# Patient Record
Sex: Male | Born: 1985 | Race: Black or African American | Hispanic: No | Marital: Married | Smoking: Never smoker
Health system: Southern US, Community
[De-identification: ages and names within clinical notes are randomized; demographics above are authoritative.]

## PROBLEM LIST (undated history)

## (undated) DIAGNOSIS — J45909 Unspecified asthma, uncomplicated: Secondary | ICD-10-CM

## (undated) DIAGNOSIS — K219 Gastro-esophageal reflux disease without esophagitis: Secondary | ICD-10-CM

---

## 2007-02-05 ENCOUNTER — Ambulatory Visit: Payer: Self-pay | Admitting: Internal Medicine

## 2007-05-05 ENCOUNTER — Ambulatory Visit: Payer: Self-pay | Admitting: Internal Medicine

## 2007-05-07 ENCOUNTER — Ambulatory Visit: Payer: Self-pay | Admitting: Internal Medicine

## 2007-10-27 ENCOUNTER — Ambulatory Visit: Payer: Self-pay | Admitting: Internal Medicine

## 2007-11-02 ENCOUNTER — Ambulatory Visit: Payer: Self-pay | Admitting: *Deleted

## 2008-02-21 ENCOUNTER — Emergency Department (HOSPITAL_COMMUNITY): Admission: EM | Admit: 2008-02-21 | Discharge: 2008-02-21 | Payer: Self-pay | Admitting: Emergency Medicine

## 2008-05-12 ENCOUNTER — Emergency Department (HOSPITAL_COMMUNITY): Admission: EM | Admit: 2008-05-12 | Discharge: 2008-05-12 | Payer: Self-pay | Admitting: Family Medicine

## 2014-03-13 DIAGNOSIS — Z8719 Personal history of other diseases of the digestive system: Secondary | ICD-10-CM | POA: Insufficient documentation

## 2014-03-13 DIAGNOSIS — M62838 Other muscle spasm: Secondary | ICD-10-CM | POA: Insufficient documentation

## 2015-06-01 DIAGNOSIS — E291 Testicular hypofunction: Secondary | ICD-10-CM | POA: Insufficient documentation

## 2015-06-01 DIAGNOSIS — N500 Atrophy of testis: Secondary | ICD-10-CM | POA: Insufficient documentation

## 2015-06-01 DIAGNOSIS — R868 Other abnormal findings in specimens from male genital organs: Secondary | ICD-10-CM | POA: Insufficient documentation

## 2015-08-02 DIAGNOSIS — R868 Other abnormal findings in specimens from male genital organs: Secondary | ICD-10-CM | POA: Insufficient documentation

## 2015-08-02 DIAGNOSIS — E6609 Other obesity due to excess calories: Secondary | ICD-10-CM | POA: Insufficient documentation

## 2017-11-17 ENCOUNTER — Other Ambulatory Visit: Payer: Self-pay | Admitting: Family Medicine

## 2017-11-17 DIAGNOSIS — R109 Unspecified abdominal pain: Secondary | ICD-10-CM

## 2018-02-01 ENCOUNTER — Ambulatory Visit
Admission: RE | Admit: 2018-02-01 | Discharge: 2018-02-01 | Disposition: A | Discharge status: Home / Self Care | Payer: BC Managed Care – PPO | Source: Ambulatory Visit | Attending: Family Medicine | Admitting: Family Medicine

## 2018-02-01 DIAGNOSIS — R109 Unspecified abdominal pain: Secondary | ICD-10-CM

## 2019-01-07 ENCOUNTER — Ambulatory Visit (INDEPENDENT_AMBULATORY_CARE_PROVIDER_SITE_OTHER): Payer: BC Managed Care – PPO

## 2019-01-07 ENCOUNTER — Other Ambulatory Visit: Payer: BC Managed Care – PPO

## 2019-01-07 ENCOUNTER — Other Ambulatory Visit: Payer: Self-pay

## 2019-01-07 ENCOUNTER — Encounter: Payer: Self-pay | Admitting: Family Medicine

## 2019-01-07 ENCOUNTER — Ambulatory Visit: Payer: BC Managed Care – PPO | Admitting: Family Medicine

## 2019-01-07 VITALS — BP 132/84 | HR 85 | Temp 97.6°F | Ht 77.0 in | Wt 330.0 lb

## 2019-01-07 DIAGNOSIS — M79672 Pain in left foot: Secondary | ICD-10-CM | POA: Diagnosis not present

## 2019-01-07 DIAGNOSIS — K219 Gastro-esophageal reflux disease without esophagitis: Secondary | ICD-10-CM | POA: Insufficient documentation

## 2019-01-07 DIAGNOSIS — G43709 Chronic migraine without aura, not intractable, without status migrainosus: Secondary | ICD-10-CM | POA: Diagnosis not present

## 2019-01-07 DIAGNOSIS — IMO0002 Reserved for concepts with insufficient information to code with codable children: Secondary | ICD-10-CM | POA: Insufficient documentation

## 2019-01-07 MED ORDER — DICLOFENAC SODIUM 75 MG PO TBEC: 75.0000 mg | DELAYED_RELEASE_TABLET | Freq: Two times a day (BID) | ORAL | 0 refills | Status: DC

## 2019-01-07 MED ORDER — PANTOPRAZOLE SODIUM 40 MG PO TBEC: 40.0000 mg | DELAYED_RELEASE_TABLET | Freq: Every day | ORAL | 0 refills | Status: DC

## 2019-01-07 NOTE — Assessment & Plan Note (Signed)
No red flags.  Will obtain records from GI.  Start Protonix 40 mg daily for the next several weeks.  Will need to check for H. pylori if it has not already been done.

## 2019-01-07 NOTE — Patient Instructions (Signed)
It was very nice to see you today!  Please make sure that you wear footwear with good arch support.  It would be a good idea to get inserts with arch support.  Please try the Voltaren twice daily for the next 1 to 2 weeks.  I would also like to get an x-ray today.  Please start the Protonix.  Take 1 pill daily for the next 6 to 8 weeks  Please try taking riboflavin to help with your migraines.  Please come back to see me in a few weeks for your physical.  Take care, Dr Jerline Pain  Please try these tips to maintain a healthy lifestyle:   Eat at least 3 REAL meals and 1-2 snacks per day.  Aim for no more than 5 hours between eating.  If you eat breakfast, please do so within one hour of getting up.    Obtain twice as many fruits/vegetables as protein or carbohydrate foods for both lunch and dinner. (Half of each meal should be fruits/vegetables, one quarter protein, and one quarter starchy carbs)   Cut down on sweet beverages. This includes juice, soda, and sweet tea.    Exercise at least 150 minutes every week.

## 2019-01-07 NOTE — Assessment & Plan Note (Signed)
No red flags.  Recommended riboflavin.  Would consider referral to headache specialist if continues to have persistent symptoms that are bothersome.

## 2019-01-07 NOTE — Progress Notes (Signed)
Chief Complaint:  Joseph [name] is a 33 y.o. male who presents today with a chief complaint of foot pain and to establish care.   Assessment/Plan:  Foot pain Has some pain along fifth metatarsal on palpation.  Patient does note that he tends to lead with his left foot and placement of his body weight on his left foot.  Will check plain film given the symptoms of been going on for several months.  We will start diclofenac 75 mg twice daily for the next couple weeks.  Also recommended footwear with good arch support.  Chronic migraine No red flags.  Recommended riboflavin.  Would consider referral to headache specialist if continues to have persistent symptoms that are bothersome.  Gastroesophageal reflux disease No red flags.  Will obtain records from GI.  Start Protonix 40 mg daily for the next several weeks.  Will need to check for H. pylori if it has not already been done.     Subjective:  HPI:  Foot Pain Started a few months ago.  Located on lateral aspect of his left foot.  Symptoms come and go.  Usually worse after prolonged periods of rest and then resolve after a few minutes of walking.  Usually has it for second the morning.  Also happens after driving for long distances.  Went to an urgent care and was told that he had inflammation and was recommended to take ibuprofen.  No trauma.  No obvious precipitating events.  No other treatments tried.  No other obvious aggravating or alleviating factors.  Symptoms are currently manageable.   His chronic medical conditions are outlined below:  # GERD - Has followed with GI in the past -Endoscopy in 2020 was reportedly normal -Improved with dietary modifications.  # Chronic Migraines - Has seen headache specialist in the past -Not currently on any medications - Past migraines at least twice per week.  Sometimes wake him up from sleep.  ROS: Per HPI, otherwise a complete review of systems was negative.   PMH:  The following  were reviewed and entered/updated in epic: History reviewed. No pertinent past medical history. Patient Active Problem List   Diagnosis Date Noted  . Gastroesophageal reflux disease 01/07/2019  . Chronic migraine 01/07/2019   History reviewed. No pertinent surgical history.  Family History  Problem Relation Age of Onset  . Hypertension Mother   . Fibromyalgia Mother   . Hypertension Father   . Diabetes Father     Medications- reviewed and updated Current Outpatient Medications  Medication Sig Dispense Refill  . diclofenac (VOLTAREN) 75 MG EC tablet Take 1 tablet (75 mg total) by mouth 2 (two) times daily. 30 tablet 0  . pantoprazole (PROTONIX) 40 MG tablet Take 1 tablet (40 mg total) by mouth daily. 90 tablet 0   No current facility-administered medications for this visit.     Allergies-reviewed and updated No Known Allergies  Social History   Socioeconomic History  . Marital status: Married    Spouse name: Not on file  . Number of children: Not on file  . Years of education: Not on file  . Highest education level: Not on file  Occupational History  . Not on file  Social Needs  . Financial resource strain: Not on file  . Food insecurity    Worry: Not on file    Inability: Not on file  . Transportation needs    Medical: Not on file    Non-medical: Not on file  Tobacco Use  .  Smoking status: Passive Smoke Exposure - Never Smoker  . Smokeless tobacco: Never Used  Substance and Sexual Activity  . Alcohol use: Never    Frequency: Never  . Drug use: Never  . Sexual activity: Not on file  Lifestyle  . Physical activity    Days per week: Not on file    Minutes per session: Not on file  . Stress: Not on file  Relationships  . Social Musician on phone: Not on file    Gets together: Not on file    Attends religious service: Not on file    Active member of club or organization: Not on file    Attends meetings of clubs or organizations: Not on file     Relationship status: Not on file  Other Topics Concern  . Not on file  Social History Narrative  . Not on file          Objective:  Physical Exam: BP 132/84 (BP Location: Left Arm, Patient Position: Sitting, Cuff Size: Large)   Pulse 85   Temp 97.6 F (36.4 C) (Temporal)   Ht 6\' 5"  (1.956 m)   Wt (!) 330 lb (149.7 kg)   SpO2 99%   BMI 39.13 kg/m   Gen: NAD, resting comfortably CV: Regular rate and rhythm with no murmurs appreciated Pulm: Normal work of breathing, clear to auscultation bilaterally with no crackles, wheezes, or rhonchi GI: Normal bowel sounds present. Soft, Nontender, Nondistended. MSK: No edema, cyanosis, or clubbing noted. -Left foot: No deformities.  Tender to palpation along distal fifth metacarpal and base of fifth metacarpal.  Pes planus noted.  Neurovascular intact distally Skin: Warm, dry Neuro: Grossly normal, moves all extremities Psych: Normal affect and thought content      Joseph [name] M. , MD 01/07/2019 9:35 AM

## 2019-01-10 NOTE — Progress Notes (Signed)
Please inform patient of the following:  Xray shows to fracture or dislocation. No degenerative changes. Would like for him to let us know if not improving and we can refer him to see sports medicine.  Algis Greenhouse. Jerline Pain, MD 01/10/2019 8:24 AM

## 2019-02-01 ENCOUNTER — Ambulatory Visit (INDEPENDENT_AMBULATORY_CARE_PROVIDER_SITE_OTHER): Payer: BC Managed Care – PPO | Admitting: Family Medicine

## 2019-02-01 ENCOUNTER — Other Ambulatory Visit: Payer: Self-pay

## 2019-02-01 ENCOUNTER — Ambulatory Visit: Payer: Self-pay

## 2019-02-01 ENCOUNTER — Encounter: Payer: Self-pay | Admitting: Family Medicine

## 2019-02-01 VITALS — BP 128/82 | HR 80 | Ht 77.0 in | Wt 339.0 lb

## 2019-02-01 VITALS — BP 128/82 | HR 80 | Temp 98.0°F | Ht 77.0 in | Wt 339.0 lb

## 2019-02-01 DIAGNOSIS — M79672 Pain in left foot: Secondary | ICD-10-CM

## 2019-02-01 DIAGNOSIS — Z6841 Body Mass Index (BMI) 40.0 and over, adult: Secondary | ICD-10-CM

## 2019-02-01 DIAGNOSIS — M7062 Trochanteric bursitis, left hip: Secondary | ICD-10-CM

## 2019-02-01 DIAGNOSIS — Z13220 Encounter for screening for lipoid disorders: Secondary | ICD-10-CM | POA: Diagnosis not present

## 2019-02-01 DIAGNOSIS — K219 Gastro-esophageal reflux disease without esophagitis: Secondary | ICD-10-CM

## 2019-02-01 DIAGNOSIS — E669 Obesity, unspecified: Secondary | ICD-10-CM

## 2019-02-01 DIAGNOSIS — G43709 Chronic migraine without aura, not intractable, without status migrainosus: Secondary | ICD-10-CM

## 2019-02-01 DIAGNOSIS — Z0001 Encounter for general adult medical examination with abnormal findings: Secondary | ICD-10-CM

## 2019-02-01 DIAGNOSIS — IMO0002 Reserved for concepts with insufficient information to code with codable children: Secondary | ICD-10-CM

## 2019-02-01 LAB — LIPID PANEL
Cholesterol: 122 mg/dL (ref 0–200)
HDL: 46.5 mg/dL (ref >39.00)
LDL Cholesterol: 66 mg/dL (ref 0–99)
NonHDL: 75.18
Total CHOL/HDL Ratio: 3
Triglycerides: 48 mg/dL (ref 0.0–149.0)
VLDL: 9.6 mg/dL (ref 0.0–40.0)

## 2019-02-01 LAB — COMPREHENSIVE METABOLIC PANEL
ALT: 31 U/L (ref 0–53)
AST: 20 U/L (ref 0–37)
Albumin: 4.3 g/dL (ref 3.5–5.2)
Alkaline Phosphatase: 73 U/L (ref 39–117)
BUN: 13 mg/dL (ref 6–23)
CO2: 25 mEq/L (ref 19–32)
Calcium: 9.5 mg/dL (ref 8.4–10.5)
Chloride: 105 mEq/L (ref 96–112)
Creatinine, Ser: 0.91 mg/dL (ref 0.40–1.50)
GFR: 115.18 mL/min (ref >60.00)
Glucose, Bld: 90 mg/dL (ref 70–99)
Potassium: 4.3 mEq/L (ref 3.5–5.1)
Sodium: 138 mEq/L (ref 135–145)
Total Bilirubin: 0.6 mg/dL (ref 0.2–1.2)
Total Protein: 7.3 g/dL (ref 6.0–8.3)

## 2019-02-01 LAB — CBC
HCT: 42.8 % (ref 39.0–52.0)
Hemoglobin: 14 g/dL (ref 13.0–17.0)
MCHC: 32.8 g/dL (ref 30.0–36.0)
MCV: 89.7 fl (ref 78.0–100.0)
Platelets: 266 10*3/uL (ref 150.0–400.0)
RBC: 4.77 Mil/uL (ref 4.22–5.81)
RDW: 13.1 % (ref 11.5–15.5)
WBC: 3.7 10*3/uL — ABNORMAL LOW (ref 4.0–10.5)

## 2019-02-01 LAB — H. PYLORI ANTIBODY, IGG: H Pylori IgG: NEGATIVE

## 2019-02-01 LAB — TSH: TSH: 1.11 u[IU]/mL (ref 0.35–4.50)

## 2019-02-01 NOTE — Progress Notes (Signed)
Chief Complaint:  Joseph [name] is a 33 y.o. male who presents today for his annual comprehensive physical exam.    Assessment/Plan:  Gastroesophageal reflux disease Check H. pylori.  Continue Protonix 40 mg daily.  Chronic migraine Stable.  Left Foot Pain Referral placed to sport medicine.   Body mass index is 40.2 kg/m. / Obese BMI Metric Follow Up - 02/01/19 0915      BMI Metric Follow Up-Please document annually   BMI Metric Follow Up  Education provided       Preventative Healthcare: Check CBC, CMEt, TSH, and lipids  Patient Counseling(The following topics were reviewed and/or handout was given):  -Nutrition: Stressed importance of moderation in sodium/caffeine intake, saturated fat and cholesterol, caloric balance, sufficient intake of fresh fruits, vegetables, and fiber.  -Stressed the importance of regular exercise.   -Substance Abuse: Discussed cessation/primary prevention of tobacco, alcohol, or other drug use; driving or other dangerous activities under the influence; availability of treatment for abuse.   -Injury prevention: Discussed safety belts, safety helmets, smoke detector, smoking near bedding or upholstery.   -Sexuality: Discussed sexually transmitted diseases, partner selection, use of condoms, avoidance of unintended pregnancy and contraceptive alternatives.   -Dental health: Discussed importance of regular tooth brushing, flossing, and dental visits.  -Health maintenance and immunizations reviewed. Please refer to Health maintenance section.  Return to care in 1 year for next preventative visit.     Subjective:  HPI:  He has no acute complaints today.   He was seen a few weeks ago for left foot pain.  Started on diclofenac.  Symptoms have not improved.  X-ray was essentially negative.  His stable, chronic medical conditions are outlined below:   # GERD - On protonix 40mg  daily and tolerating well - ROS: No reported unintentional weight loss or  early satiety.  # Chronic Migraine  Lifestyle Diet: No specific diets or eating plans. Trying to cut out fried foods.  Exercise: No specific activities. Limited due to foot pain.   Depression screen PHQ 2/9 01/07/2019  Decreased Interest 0  Down, Depressed, Hopeless 0  PHQ - 2 Score 0    Health Maintenance Due  Topic Date Due  . HIV Screening  10/06/1999  . TETANUS/TDAP  10/06/2003     ROS: Per HPI, otherwise a complete review of systems was negative.   PMH:  The following were reviewed and entered/updated in epic: History reviewed. No pertinent past medical history. Patient Active Problem List   Diagnosis Date Noted  . Gastroesophageal reflux disease 01/07/2019  . Chronic migraine 01/07/2019   History reviewed. No pertinent surgical history.  Family History  Problem Relation Age of Onset  . Hypertension Mother   . Fibromyalgia Mother   . Hypertension Father   . Diabetes Father     Medications- reviewed and updated Current Outpatient Medications  Medication Sig Dispense Refill  . diclofenac (VOLTAREN) 75 MG EC tablet Take 1 tablet (75 mg total) by mouth 2 (two) times daily. 30 tablet 0  . pantoprazole (PROTONIX) 40 MG tablet Take 1 tablet (40 mg total) by mouth daily. 90 tablet 0   No current facility-administered medications for this visit.     Allergies-reviewed and updated No Known Allergies  Social History   Socioeconomic History  . Marital status: Married    Spouse name: Not on file  . Number of children: Not on file  . Years of education: Not on file  . Highest education level: Not on file  Occupational History  .  Not on file  Social Needs  . Financial resource strain: Not on file  . Food insecurity    Worry: Not on file    Inability: Not on file  . Transportation needs    Medical: Not on file    Non-medical: Not on file  Tobacco Use  . Smoking status: Passive Smoke Exposure - Never Smoker  . Smokeless tobacco: Never Used  Substance and  Sexual Activity  . Alcohol use: Never    Frequency: Never  . Drug use: Never  . Sexual activity: Not on file  Lifestyle  . Physical activity    Days per week: Not on file    Minutes per session: Not on file  . Stress: Not on file  Relationships  . Social Musician on phone: Not on file    Gets together: Not on file    Attends religious service: Not on file    Active member of club or organization: Not on file    Attends meetings of clubs or organizations: Not on file    Relationship status: Not on file  Other Topics Concern  . Not on file  Social History Narrative  . Not on file        Objective:  Physical Exam: BP 128/82 (BP Location: Left Arm, Patient Position: Sitting, Cuff Size: Large)   Pulse 80   Temp 98 F (36.7 C) (Temporal)   Ht 6\' 5"  (1.956 m)   Wt (!) 339 lb (153.8 kg)   SpO2 98%   BMI 40.20 kg/m   Body mass index is 40.2 kg/m. Wt Readings from Last 3 Encounters:  02/01/19 (!) 339 lb (153.8 kg)  01/07/19 (!) 330 lb (149.7 kg)   Gen: NAD, resting comfortably HEENT: TMs normal bilaterally. OP clear. No thyromegaly noted.  CV: RRR with no murmurs appreciated Pulm: NWOB, CTAB with no crackles, wheezes, or rhonchi GI: Normal bowel sounds present. Soft, Nontender, Nondistended. MSK: no edema, cyanosis, or clubbing noted Skin: warm, dry Neuro: CN2-12 grossly intact. Strength 5/5 in upper and lower extremities. Reflexes symmetric and intact bilaterally.  Psych: Normal affect and thought content      M. 13/06/20, MD 02/01/2019 9:16 AM

## 2019-02-01 NOTE — Assessment & Plan Note (Signed)
Stable

## 2019-02-01 NOTE — Patient Instructions (Addendum)
Please perform the exercise program that we have prepared for you and gone over in detail on a daily basis.  In addition to the handout you were provided you can access your program through: [email or web address]   Your unique program code is:  [id number]  Use the voltaren gel for pain up to 4x daily as needed.  Consider high quality sports insoles.   Recheck in 1 month if not improving.

## 2019-02-01 NOTE — Patient Instructions (Signed)
It was very nice to see you today!  We will check blood work today.  I will place a referral for you to see Dr Georgina Snell - please schedule today.  Come back in a year for your next physical, or sooner if needed.   Take care, Dr Jerline Pain  Please try these tips to maintain a healthy lifestyle:   Eat at least 3 REAL meals and 1-2 snacks per day.  Aim for no more than 5 hours between eating.  If you eat breakfast, please do so within one hour of getting up.    Obtain twice as many fruits/vegetables as protein or carbohydrate foods for both lunch and dinner. (Half of each meal should be fruits/vegetables, one quarter protein, and one quarter starchy carbs)   Cut down on sweet beverages. This includes juice, soda, and sweet tea.    Exercise at least 150 minutes every week.    Preventive Care 2-69 Years Old, Male Preventive care refers to lifestyle choices and visits with your health care provider that can promote health and wellness. This includes:  A yearly physical exam. This is also called an annual well check.  Regular dental and eye exams.  Immunizations.  Screening for certain conditions.  Healthy lifestyle choices, such as eating a healthy diet, getting regular exercise, not using drugs or products that contain nicotine and tobacco, and limiting alcohol use. What can I expect for my preventive care visit? Physical exam Your health care provider will check:  Height and weight. These may be used to calculate body mass index (BMI), which is a measurement that tells if you are at a healthy weight.  Heart rate and blood pressure.  Your skin for abnormal spots. Counseling Your health care provider may ask you questions about:  Alcohol, tobacco, and drug use.  Emotional well-being.  Home and relationship well-being.  Sexual activity.  Eating habits.  Work and work Statistician. What immunizations do I need?  Influenza (flu) vaccine  This is recommended every year.  Tetanus, diphtheria, and pertussis (Tdap) vaccine  You may need a Td booster every 10 years. Varicella (chickenpox) vaccine  You may need this vaccine if you have not already been vaccinated. Human papillomavirus (HPV) vaccine  If recommended by your health care provider, you may need three doses over 6 months. Measles, mumps, and rubella (MMR) vaccine  You may need at least one dose of MMR. You may also need a second dose. Meningococcal conjugate (MenACWY) vaccine  One dose is recommended if you are 4-57 years old and a Market researcher living in a residence hall, or if you have one of several medical conditions. You may also need additional booster doses. Pneumococcal conjugate (PCV13) vaccine  You may need this if you have certain conditions and were not previously vaccinated. Pneumococcal polysaccharide (PPSV23) vaccine  You may need one or two doses if you smoke cigarettes or if you have certain conditions. Hepatitis A vaccine  You may need this if you have certain conditions or if you travel or work in places where you may be exposed to hepatitis A. Hepatitis B vaccine  You may need this if you have certain conditions or if you travel or work in places where you may be exposed to hepatitis B. Haemophilus influenzae type b (Hib) vaccine  You may need this if you have certain risk factors. You may receive vaccines as individual doses or as more than one vaccine together in one shot (combination vaccines). Talk with your health  care provider about the risks and benefits of combination vaccines. What tests do I need? Blood tests  Lipid and cholesterol levels. These may be checked every 5 years starting at age 32.  Hepatitis C test.  Hepatitis B test. Screening   Diabetes screening. This is done by checking your blood sugar (glucose) after you have not eaten for a while (fasting).  Sexually transmitted disease (STD) testing. Talk with your health care  provider about your test results, treatment options, and if necessary, the need for more tests. Follow these instructions at home: Eating and drinking   Eat a diet that includes fresh fruits and vegetables, whole grains, lean protein, and low-fat dairy products.  Take vitamin and mineral supplements as recommended by your health care provider.  Do not drink alcohol if your health care provider tells you not to drink.  If you drink alcohol: ? Limit how much you have to 0-2 drinks a day. ? Be aware of how much alcohol is in your drink. In the [place] one drink equals one 12 oz bottle of beer (355 mL), one 5 oz glass of wine (148 mL), or one 1 oz glass of hard liquor (44 mL). Lifestyle  Take daily care of your teeth and gums.  Stay active. Exercise for at least 30 minutes on 5 or more days each week.  Do not use any products that contain nicotine or tobacco, such as cigarettes, e-cigarettes, and chewing tobacco. If you need help quitting, ask your health care provider.  If you are sexually active, practice safe sex. Use a condom or other form of protection to prevent STIs (sexually transmitted infections). What's next?  Go to your health care provider once a year for a well check visit.  Ask your health care provider how often you should have your eyes and teeth checked.  Stay up to date on all vaccines. This information is not intended to replace advice given to you by your health care provider. Make sure you discuss any questions you have with your health care provider. Document Released: 04/15/2001 Document Revised: 02/11/2018 Document Reviewed: 02/11/2018 Elsevier Patient Education  2020 [employer].

## 2019-02-01 NOTE — Progress Notes (Signed)
Subjective:    I'm seeing this patient as a consultation for:  Dr Jimmey Ralph.  Note will be routed back to referring provider/PCP.  I, [name], LAT, ATC, am serving as scribe for Dr. [name].  CC: L foot pain  HPI: Pt is a 33 y/o male presenting w/ c/o L foot pain x several months.  He saw his PCP for this issue initially on 01/07/19 and had an XR of his L foot and was prescribed oral diclofenac 75 mg bid.  Currently, pt notes pain located along his L lateral/plantar foot w/ insidious onset.  He reports no change in his symptoms since starting the oral diclofenac.  He rates his pain as a 4-5/10 when he first gets out of bed.  He also notes increased pain after getting out of the car or after sitting in a resting position for a prolonged period of time.  He has tried the oral diclofenac and some OTC shoe inserts.  He does not report any swelling in the area of his pain.  Additionally he notes bilateral left worse than right lateral hip pain.  He had a fall a few years ago and developed he reports hip tendinopathy.  He is done some treatment for this off-and-on but notes recently has been a little more painful especially the lateral hip on the left side.  Pain is worse with activity and better with rest.  Past medical history, Surgical history, Family history not pertinant except as noted below, Social history, Allergies, and medications have been entered into the medical record, reviewed, and no changes needed.   Review of Systems: No headache, visual changes, nausea, vomiting, diarrhea, constipation, dizziness, abdominal pain, skin rash, fevers, chills, night sweats, weight loss, swollen lymph nodes, body aches, joint swelling, muscle aches, chest pain, shortness of breath, mood changes, visual or auditory hallucinations.   Objective:    Vitals:   02/01/19 0935  BP: 128/82  Pulse: 80  SpO2: 98%   General: Well Developed, well nourished, and in no acute distress.  Neuro/Psych: Alert and  oriented x3, extra-ocular muscles intact, able to move all 4 extremities, sensation grossly intact. Skin: Warm and dry, no rashes noted.  Respiratory: Not using accessory muscles, speaking in full sentences, trachea midline.  Cardiovascular: Pulses palpable, no extremity edema. Abdomen: Does not appear distended. MSK:  Left hip: Normal-appearing not particularly tender.  Hip abduction strength diminished 4/5.  External rotation strength intact 5/5 internal rotation and adduction intact 5/5.  Right hip: Normal-appearing nontender. Hip abduction strength slightly diminished 4+/5.  External rotation strength and internal rotation and adduction intact 5/5.  Left foot: Pes planus otherwise normal-appearing Normal foot and ankle motion. Nontender to palpation. Intact strength to dorsiflexion plantarflexion eversion inversion.   Right foot: Pes planus otherwise normal. Normal foot and ankle motion. Nontender to palpation. Intact strength.  Pulses cap refill and sensation intact feet bilaterally.  Lab and Radiology Results Dg Foot Complete Left  Result Date: 01/07/2019 CLINICAL DATA:  Pain EXAM: LEFT FOOT - COMPLETE 3+ VIEW COMPARISON:  None. FINDINGS: Frontal, oblique, and lateral views were obtained. There is no demonstrable fracture or dislocation. Joint spaces appear unremarkable. Note that there is persistent flexion of the fourth and fifth PIP joints. No erosive change. IMPRESSION: No fracture or dislocation. No appreciable joint space narrowing or erosion. Persistent flexion of fourth and fifth PIP joints. Electronically Signed   By: Bretta Bang III M.D.   On: 01/07/2019 15:23   I, [name], personally (  independently) visualized and performed the interpretation of the images attached in this note.  Limited musculoskeletal ultrasound lateral left foot and ankle. Normal-appearing peroneal tendons.  Normal-appearing fifth metatarsal Normal bony structures. Impression: Normal  left lateral foot musculoskeletal ultrasound.  Impression and Recommendations:    Assessment and Plan: 33 y.o. male with  Left lateral plantar foot pain.  Predominant pain occurs with standing after period of rest and is consistent in description but not location with plantar fasciitis.  It is possible he has plantar fasciitis of the lateral band of the plantar fascia or tendinitis of the posterior tibialis tendon as it courses to the plantar aspect of the foot or the peroneal longus tendon also coursing on the plantar aspect of the foot.  Regardless we will treat with home exercise program focused on eccentric exercises.  Additionally will use diclofenac gel and recommend high-quality sports insoles.  Recheck in 1 month if not all better.  Left lateral hip pain.  Hip abductor tendinopathy/trochanteric bursitis.  Work on home exercise program. Recheck 1 month as above.  PDMP not reviewed this encounter. Orders Placed This Encounter  Procedures  . NO CHG - [place] LOWER LEFT    Order Specific Question:   Reason for Exam (SYMPTOM  OR DIAGNOSIS REQUIRED)    Answer:   L foot pain    Order Specific Question:   Preferred imaging location?    Answer:   [facility]   No orders of the defined types were placed in this encounter.   Discussed warning signs or symptoms. Please see discharge instructions. Patient expresses understanding.  The above documentation has been reviewed and is accurate and complete Lynne Leader

## 2019-02-01 NOTE — Assessment & Plan Note (Signed)
Check H. pylori.  Continue Protonix 40 mg daily.

## 2019-02-02 NOTE — Progress Notes (Signed)
Please inform patient of the following:  Good news! All of his blood work is NORMAL. Do not need to make any changes to his treatment plan at this time. Would like for him to continue working on diet and exercise and we can recheck in a year or so.  Joseph [name]. Jerline Pain, MD 02/02/2019 7:52 AM

## 2019-02-23 ENCOUNTER — Encounter: Payer: Self-pay | Admitting: Family Medicine

## 2019-03-30 ENCOUNTER — Other Ambulatory Visit: Payer: Self-pay | Admitting: Family Medicine

## 2019-05-03 ENCOUNTER — Ambulatory Visit (INDEPENDENT_AMBULATORY_CARE_PROVIDER_SITE_OTHER): Payer: BC Managed Care – PPO | Admitting: Family Medicine

## 2019-05-03 DIAGNOSIS — IMO0002 Reserved for concepts with insufficient information to code with codable children: Secondary | ICD-10-CM

## 2019-05-03 DIAGNOSIS — G43709 Chronic migraine without aura, not intractable, without status migrainosus: Secondary | ICD-10-CM | POA: Diagnosis not present

## 2019-05-03 NOTE — Progress Notes (Signed)
   Joseph [name] is a 34 y.o. male who presents today for a virtual office visit.  Assessment/Plan:  Chronic Problems Addressed Today: Chronic migraine Worsened recommended he restart the supplements including riboflavin.  Will place referral to headache clinic per patient request.  Discussed reasons return to care.      Subjective:  HPI:  Patient started having change in migraine pattern couple weeks ago.  Now involving bilateral aspect of his head.  He has had migraines in the past.  His actual headaches have been about the same but has had some associated symptoms that are new.  He has had some increased lethargy and fatigue.  Is also had some increased brain fog and dizzy spells.  Symptoms are stable. No weakness or numbness. No nausea or vomiting.  No fevers or chills.  No vision changes.  He has tried taking ibuprofen and iron supplements with modest improvement.  He has tried increasing his fluid intake as well.  He has not been taking his B supplements for the last couple of weeks.        Objective/Observations  Physical Exam: Gen: NAD, resting comfortably Pulm: Normal work of breathing Neuro: Grossly normal, moves all extremities Psych: Normal affect and thought content  Virtual Visit via Video   I connected with Joseph [name] on 05/03/19 at  3:40 PM EST by a video enabled telemedicine application and verified that I am speaking with the correct person using two identifiers. The limitations of evaluation and management by telemedicine and the availability of in person appointments were discussed. The patient expressed understanding and agreed to proceed.   Patient location: Home Provider location: [place] [employer] Persons participating in the virtual visit: Myself and Patient     Katina Degree. Jimmey Ralph, MD 05/03/2019 12:21 PM

## 2019-05-03 NOTE — Assessment & Plan Note (Signed)
Worsened recommended he restart the supplements including riboflavin.  Will place referral to headache clinic per patient request.  Discussed reasons return to care.

## 2020-02-08 ENCOUNTER — Emergency Department
Admission: EM | Admit: 2020-02-08 | Discharge: 2020-02-08 | Disposition: A | Discharge status: Home / Self Care | Payer: BC Managed Care – PPO | Attending: Emergency Medicine | Admitting: Emergency Medicine

## 2020-02-08 ENCOUNTER — Emergency Department: Payer: BC Managed Care – PPO

## 2020-02-08 ENCOUNTER — Other Ambulatory Visit: Payer: Self-pay

## 2020-02-08 ENCOUNTER — Encounter: Payer: Self-pay | Admitting: Emergency Medicine

## 2020-02-08 DIAGNOSIS — M25512 Pain in left shoulder: Secondary | ICD-10-CM | POA: Insufficient documentation

## 2020-02-08 DIAGNOSIS — Z7722 Contact with and (suspected) exposure to environmental tobacco smoke (acute) (chronic): Secondary | ICD-10-CM | POA: Insufficient documentation

## 2020-02-08 DIAGNOSIS — K219 Gastro-esophageal reflux disease without esophagitis: Secondary | ICD-10-CM | POA: Diagnosis not present

## 2020-02-08 DIAGNOSIS — J45909 Unspecified asthma, uncomplicated: Secondary | ICD-10-CM | POA: Insufficient documentation

## 2020-02-08 DIAGNOSIS — R1013 Epigastric pain: Secondary | ICD-10-CM | POA: Diagnosis present

## 2020-02-08 DIAGNOSIS — M25511 Pain in right shoulder: Secondary | ICD-10-CM | POA: Insufficient documentation

## 2020-02-08 HISTORY — DX: Gastro-esophageal reflux disease without esophagitis: K21.9

## 2020-02-08 HISTORY — DX: Unspecified asthma, uncomplicated: J45.909

## 2020-02-08 LAB — CBC
HCT: 43.5 % (ref 39.0–52.0)
Hemoglobin: 14.1 g/dL (ref 13.0–17.0)
MCH: 29.5 pg (ref 26.0–34.0)
MCHC: 32.4 g/dL (ref 30.0–36.0)
MCV: 91 fL (ref 80.0–100.0)
Platelets: 292 10*3/uL (ref 150–400)
RBC: 4.78 MIL/uL (ref 4.22–5.81)
RDW: 12.5 % (ref 11.5–15.5)
WBC: 5.5 10*3/uL (ref 4.0–10.5)
nRBC: 0 % (ref 0.0–0.2)

## 2020-02-08 LAB — TROPONIN I (HIGH SENSITIVITY)
Troponin I (High Sensitivity): 2 ng/L (ref <18)
Troponin I (High Sensitivity): 3 ng/L (ref <18)

## 2020-02-08 LAB — BASIC METABOLIC PANEL
Anion gap: 10 (ref 5–15)
BUN: 12 mg/dL (ref 6–20)
CO2: 24 mmol/L (ref 22–32)
Calcium: 9.3 mg/dL (ref 8.9–10.3)
Chloride: 104 mmol/L (ref 98–111)
Creatinine, Ser: 0.89 mg/dL (ref 0.61–1.24)
GFR, Estimated: >60 mL/min (ref >60)
Glucose, Bld: 92 mg/dL (ref 70–99)
Potassium: 3.7 mmol/L (ref 3.5–5.1)
Sodium: 138 mmol/L (ref 135–145)

## 2020-02-08 MED ORDER — LIDOCAINE VISCOUS HCL 2 % MT SOLN
15.0000 mL | Freq: Once | OROMUCOSAL | Status: AC
  Administered 2020-02-08: 15 mL via ORAL
  Filled 2020-02-08: qty 15

## 2020-02-08 MED ORDER — FAMOTIDINE 20 MG PO TABS
20.0000 mg | ORAL_TABLET | Freq: Once | ORAL | Status: AC
  Administered 2020-02-08: 20 mg via ORAL
  Filled 2020-02-08: qty 1

## 2020-02-08 MED ORDER — ALUM & MAG HYDROXIDE-SIMETH 200-200-20 MG/5ML PO SUSP
30.0000 mL | Freq: Once | ORAL | Status: AC
  Administered 2020-02-08: 30 mL via ORAL
  Filled 2020-02-08: qty 30

## 2020-02-08 MED ORDER — OMEPRAZOLE 10 MG PO CPDR: 10.0000 mg | DELAYED_RELEASE_CAPSULE | Freq: Every day | ORAL | 6 refills | Status: DC

## 2020-02-08 NOTE — ED Provider Notes (Signed)
[facility] Emergency Department Provider Note  ____________________________________________  Time seen: Approximately 4:31 PM  I have reviewed the triage vital signs and the nursing notes.   HISTORY  Chief Complaint Chest Pain and Arm Pain    HPI Joseph [name] is a 34 y.o. male who presents the emergency department complaining of pressure in the epigastric and chest region with some radiation into the shoulders.  Patient states that he has a history of same, several years ago had similar symptoms, was diagnosed with acid reflux with what sounds like a pneumomediastinum.  Patient had been placed on some medications, has been doing very well he eventually stopped the acid reflux medicines as he had had no symptoms.  Patient states that he is gone 6 to 8 months without any reflux until he ate a somewhat spicy meal over the weekend.  Patient states that that night he did not have any symptoms but when he woke up he had similar acid reflux symptoms.  As this did not settle down he presents the emergency department to ensure no cardiac involvement.  No palpitations.  No radiation to the neck or jaw.  No cardiac history.  Patient denies any headache, URI symptoms, shortness of breath, abdominal pain other than the epigastric symptoms, nausea vomiting, diarrhea or constipation.   Patient has had increased belching.        Past Medical History:  Diagnosis Date  . Asthma   . GERD (gastroesophageal reflux disease)     Patient Active Problem List   Diagnosis Date Noted  . Gastroesophageal reflux disease 01/07/2019  . Chronic migraine 01/07/2019    History reviewed. No pertinent surgical history.  Prior to Admission medications   Medication Sig Start Date End Date Taking? Authorizing Provider  omeprazole (PRILOSEC) 10 MG capsule Take 1 capsule (10 mg total) by mouth daily. 02/08/20   Jasman Pfeifle, Delorise Royals, PA-C    Allergies Patient has no known allergies.  Family  History  Problem Relation Age of Onset  . Hypertension Mother   . Fibromyalgia Mother   . Hypertension Father   . Diabetes Father     Social History Social History   Tobacco Use  . Smoking status: Passive Smoke Exposure - Never Smoker  . Smokeless tobacco: Never Used  Substance Use Topics  . Alcohol use: Never  . Drug use: Never     Review of Systems  Constitutional: No fever/chills Eyes: No visual changes. No discharge ENT: No upper respiratory complaints. Cardiovascular: no chest pain. Respiratory: no cough. No SOB. Gastrointestinal: Epigastric abdominal pain.  Increased belching, reflux symptoms.  No nausea, no vomiting.  No diarrhea.  No constipation. Genitourinary: Negative for dysuria. No hematuria Musculoskeletal: Negative for musculoskeletal pain. Skin: Negative for rash, abrasions, lacerations, ecchymosis. Neurological: Negative for headaches, focal weakness or numbness.  10 System ROS otherwise negative.  ____________________________________________   PHYSICAL EXAM:  VITAL SIGNS: ED Triage Vitals [02/08/20 1302]  Enc Vitals Group     BP 137/77     Pulse Rate 70     Resp 18     Temp 99 F (37.2 C)     Temp Source Oral     SpO2 100 %     Weight (!) 336 lb (152.4 kg)     Height 6\' 5"  (1.956 m)     Head Circumference      Peak Flow      Pain Score 2     Pain Loc      Pain  Edu?      Excl. in GC?      Constitutional: Alert and oriented. Well appearing and in no acute distress. Eyes: Conjunctivae are normal. PERRL. EOMI. Head: Atraumatic. ENT:      Ears:       Nose: No congestion/rhinnorhea.      Mouth/Throat: Mucous membranes are moist.  Neck: No stridor.    Cardiovascular: Normal rate, regular rhythm. Normal S1 and S2.  Good peripheral circulation. Respiratory: Normal respiratory effort without tachypnea or retractions. Lungs CTAB. Good air entry to the bases with no decreased or absent breath sounds. Gastrointestinal: No visible external  abdominal wall findings.  Bowel sounds 4 quadrants. Soft and nontender to palpation all quadrants.  No guarding or rigidity. No palpable masses. No distention. No CVA tenderness. Musculoskeletal: Full range of motion to all extremities. No gross deformities appreciated. Neurologic:  Normal speech and language. No gross focal neurologic deficits are appreciated.  Skin:  Skin is warm, dry and intact. No rash noted. Psychiatric: Mood and affect are normal. Speech and behavior are normal. Patient exhibits appropriate insight and judgement.   ____________________________________________   LABS (all labs ordered are listed, but only abnormal results are displayed)  Labs Reviewed  BASIC METABOLIC PANEL  CBC  TROPONIN I (HIGH SENSITIVITY)  TROPONIN I (HIGH SENSITIVITY)   ____________________________________________  EKG   ____________________________________________  RADIOLOGY I personally viewed and evaluated these images as part of my medical decision making, as well as reviewing the written report by the radiologist.  ED Provider Interpretation: No cardiopulmonary abnormality.  No evidence of pneumomediastinum  DG Chest 2 View  Result Date: 02/08/2020 CLINICAL DATA:  Chest pain. EXAM: CHEST - 2 VIEW COMPARISON:  No pertinent prior exams available for comparison. FINDINGS: Heart size within normal limits. No appreciable airspace consolidation.No evidence of pleural effusion or pneumothorax. No acute bony abnormality identified. IMPRESSION: No evidence of acute cardiopulmonary abnormality. Electronically Signed   By: Jackey Loge DO   On: 02/08/2020 14:17    ____________________________________________    PROCEDURES  Procedure(s) performed:    Procedures    Medications  alum & mag hydroxide-simeth (MAALOX/MYLANTA) 200-200-20 MG/5ML suspension 30 mL (has no administration in time range)    And  lidocaine (XYLOCAINE) 2 % viscous mouth solution 15 mL (has no administration  in time range)  famotidine (PEPCID) tablet 20 mg (has no administration in time range)     ____________________________________________   INITIAL IMPRESSION / ASSESSMENT AND PLAN / ED COURSE  Pertinent labs & imaging results that were available during my care of the patient were reviewed by me and considered in my medical decision making (see chart for details).  Review of the Secretary CSRS was performed in accordance of the [facility] prior to dispensing any controlled drugs.           Patient's diagnosis is consistent with acid reflux.  Patient presented to the emergency department with epigastric pain radiating into the shoulders with increased belching.  Patient states that he has a history of acid reflux, and had symptoms several years ago that were quite severe, and ended up having a pneumomediastinum.  Patient states that this resolved without difficulty.  He had been on a PPI for multiple years but had not had symptoms in several years so easy self discontinued the medicine.  Patient states that he had been doing well until he ate a spicy meal several days ago and then has had symptoms again.  X-ray revealed no evidence of pneumomediastinum.  Exam was reassuring.  Labs are reassuring.  I feel based off of patient's symptoms, physical exam that this likely is acid reflux.  I will give GI cocktail plus oral famotidine here and discharge the patient with a prescription for omeprazole..  Differential included GERD, gastritis, peptic ulcer, ACS/STEMI, pneumonia or bronchitis.  Return precautions discussed with the patient.  Otherwise follow-up with primary care.  Patient is given ED precautions to return to the ED for any worsening or new symptoms.     ____________________________________________  FINAL CLINICAL IMPRESSION(S) / ED DIAGNOSES  Final diagnoses:  Gastroesophageal reflux disease, unspecified whether esophagitis present      NEW MEDICATIONS STARTED DURING THIS VISIT:  ED  Discharge Orders         Ordered    omeprazole (PRILOSEC) 10 MG capsule  Daily        02/08/20 1652              This chart was dictated using voice recognition software/Dragon. Despite best efforts to proofread, errors can occur which can change the meaning. Any change was purely unintentional.    Racheal Patches, PA-C 02/08/20 1652    Delton Prairie, MD 02/08/20 [phone]

## 2020-02-08 NOTE — ED Triage Notes (Signed)
Pt comes into the ED via POV c/o chest pain on the left side as well as radiation into the left arm and right shoulder.  Pt also admits to excessive belching and reflux and bloating problems.  Pt has even and unlabored respirations at this time.  Denies any dizziness or SHOB.

## 2020-02-09 ENCOUNTER — Telehealth: Payer: Self-pay

## 2020-02-09 NOTE — Telephone Encounter (Signed)
Patient was seen in ED yesterday.   Nurse Assessment Nurse: Joseph Knife, RN, Joseph [name] Date/Time Lamount Cohen Time): 02/08/2020 11:52:24 AM Confirm and document reason for call. If symptomatic, describe symptoms. ---Caller states that he is having indigestion, chest and shoulder pain. Started on Monday morning with arm stiffness. Has a hx of air getting trapped in chest. No difficulty breathing. Does the patient have any new or worsening symptoms? ---Yes Will a triage be completed? ---Yes Related visit to physician within the last 2 weeks? ---No Does the PT have any chronic conditions? (i.e. diabetes, asthma, this includes High risk factors for pregnancy, etc.) ---No Is this a behavioral health or substance abuse call? ---No Guidelines Guideline Title Affirmed Question Affirmed Notes Nurse Date/Time (Eastern Time) Chest Pain Pain also in shoulder(s) or arm(s) or jaw (Exception: pain is clearly made worse by movement) Turner, RN, Joseph [name] 02/08/2020 11:53:58 AM Disp. Time Lamount Cohen Time) Disposition Final User 02/08/2020 11:50:46 AM Send to Urgent Queue Cherylynn Ridges 02/08/2020 11:57:34 AM Go to ED Now Yes Joseph Knife, RN, Joseph [name] PLEASE NOTE: All timestamps contained within this report are represented as [place] Standard Time. CONFIDENTIALTY NOTICE: This fax transmission is intended only for the addressee. It contains information that is legally privileged, confidential or otherwise protected from use or disclosure. If you are not the intended recipient, you are strictly prohibited from reviewing, disclosing, copying using or disseminating any of this information or taking any action in reliance on or regarding this information. If you have received this fax in error, please notify us immediately by telephone so that we can arrange for its return to [place]. Phone: [phone], Toll-Free: [phone], Fax: [phone] Page: 2 of 2 Call Id: [id number] Caller Disagree/Comply Comply Caller Understands  Yes PreDisposition Call Doctor Care Advice Given Per Guideline GO TO ED NOW: * You need to be seen in the Emergency Department. CALL EMS IF: * Severe difficulty breathing occurs * Passes out or becomes too weak to stand * You become worse CARE ADVICE given per Chest Pain (Adult) guideline. Referrals GO TO FACILITY OTHER - SPECIF

## 2020-03-22 ENCOUNTER — Other Ambulatory Visit: Payer: BC Managed Care – PPO

## 2020-03-26 ENCOUNTER — Encounter: Payer: Self-pay | Admitting: Family Medicine

## 2020-03-26 ENCOUNTER — Telehealth (INDEPENDENT_AMBULATORY_CARE_PROVIDER_SITE_OTHER): Payer: Self-pay | Admitting: Family Medicine

## 2020-03-26 VITALS — Ht 77.0 in | Wt 334.0 lb

## 2020-03-26 DIAGNOSIS — J329 Chronic sinusitis, unspecified: Secondary | ICD-10-CM

## 2020-03-26 MED ORDER — IPRATROPIUM BROMIDE 0.06 % NA SOLN: 2.0000 | Freq: Four times a day (QID) | NASAL | 12 refills | Status: DC

## 2020-03-26 NOTE — Progress Notes (Signed)
   Woodfin Kiss is a 35 y.o. male who presents today for a virtual office visit.  Assessment/Plan:  New/Acute Problems: COVID No red flags.  Thankfully symptoms are mild and improving.  Still has a bit of sinus congestion.  Will start Atrovent.  Recommended honey as needed for cough.  Encouraged good oral hydration.  Discussed reasons to return to care.  Follow-up as needed.    Subjective:  HPI:  He is here with COVID symptoms for the last several days.  Tested positive for days ago.  Is having some issues with fatigue and cough.  Symptoms have mostly resolved though still has a bit of sinus congestion.  Will be returning to work in a couple of days.       Objective/Observations  Physical Exam: Gen: NAD, resting comfortably Pulm: Normal work of breathing Neuro: Grossly normal, moves all extremities Psych: Normal affect and thought content  Virtual Visit via Video   I connected with Micah Noel on 03/26/20 at  4:00 PM EST by a video enabled telemedicine application and verified that I am speaking with the correct person using two identifiers. The limitations of evaluation and management by telemedicine and the availability of in person appointments were discussed. The patient expressed understanding and agreed to proceed.   Patient location: Home Provider location: [place] [employer] Persons participating in the virtual visit: Myself and Patient     Katina Degree. Jimmey Ralph, MD 03/26/2020 4:21 PM

## 2020-04-18 ENCOUNTER — Encounter: Payer: Self-pay | Admitting: Family Medicine

## 2020-04-18 NOTE — Telephone Encounter (Signed)
See note

## 2020-04-19 NOTE — Telephone Encounter (Signed)
Patient has been scheduled

## 2020-04-20 ENCOUNTER — Ambulatory Visit (INDEPENDENT_AMBULATORY_CARE_PROVIDER_SITE_OTHER): Payer: Self-pay | Admitting: Family Medicine

## 2020-04-20 ENCOUNTER — Other Ambulatory Visit: Payer: Self-pay

## 2020-04-20 ENCOUNTER — Encounter: Payer: Self-pay | Admitting: Family Medicine

## 2020-04-20 VITALS — BP 113/74 | HR 79 | Temp 98.1°F | Ht 77.0 in | Wt 340.0 lb

## 2020-04-20 DIAGNOSIS — H6120 Impacted cerumen, unspecified ear: Secondary | ICD-10-CM

## 2020-04-20 DIAGNOSIS — H6993 Unspecified Eustachian tube disorder, bilateral: Secondary | ICD-10-CM

## 2020-04-20 DIAGNOSIS — R42 Dizziness and giddiness: Secondary | ICD-10-CM

## 2020-04-20 DIAGNOSIS — H6983 Other specified disorders of Eustachian tube, bilateral: Secondary | ICD-10-CM

## 2020-04-20 MED ORDER — IPRATROPIUM BROMIDE 0.06 % NA SOLN
2.0000 | Freq: Four times a day (QID) | NASAL | 12 refills | Status: DC
Start: 2020-04-20 — End: 2021-08-28

## 2020-04-20 NOTE — Progress Notes (Signed)
   Joseph [name] is a 35 y.o. male who presents today for an office visit.  Assessment/Plan:  Dizziness / Cerumen Impaction / Eustachian Tube Dysfunction Likely secondary to eustachian tube dysfunction and cerumen impaction today.  Reassuring neuro exam today.  No current symptoms.  Cerumen impaction successfully irrigated by RMA today.  We will start Atrovent to treat underlying eustachian tube dysfunction.  He will follow-up with me in the next week or so symptoms or not improving.    Subjective:  HPI:  Patient here with a couple weeks of intermittent dizziness spells.  He had a upper respiratory infection about 3 weeks ago.  Since recovered from.  Occasionally gets a "weird headache".  Will last for a few moments and then stop.  Describes an off-balance sensation.  No weakness or numbness.  No vision changes.  No symptoms at rest.  Usually occurs while sitting.  No light or sound sensitivty.        Objective:  Physical Exam: BP 113/74   Pulse 79   Temp 98.1 F (36.7 C) (Temporal)   Ht 6\' 5"  (1.956 m)   Wt (!) 340 lb (154.2 kg)   SpO2 98%   BMI 40.32 kg/m   Gen: No acute distress, resting comfortably HEENT: Left TM with clear effusion.  Right TM obscured by cerumen.  Cerumen was successfully irrigated and significant erythema and irritation in right EAC was noted.  Also with some clear effusion in right TM. CV: Regular rate and rhythm with no murmurs appreciated Pulm: Normal work of breathing, clear to auscultation bilaterally with no crackles, wheezes, or rhonchi Neuro: CN2-12 intact.  Finger-nose-finger testing intact bilaterally. Neuro: Grossly normal, moves all extremities Psych: Normal affect and thought content      Caleb M. , MD 04/20/2020 12:05 PM

## 2020-04-20 NOTE — Patient Instructions (Signed)
It was very nice to see you today!  Your ear was blocked.  We remove the blockage today.  This was probably causing your symptoms.  You also have some fluid buildup behind your ears.  Please try the nasal spray for the next week or so and let me know if not improving.  Take care, Dr Jimmey Ralph  Please try these tips to maintain a healthy lifestyle:   Eat at least 3 REAL meals and 1-2 snacks per day.  Aim for no more than 5 hours between eating.  If you eat breakfast, please do so within one hour of getting up.    Each meal should contain half fruits/vegetables, one quarter protein, and one quarter carbs (no bigger than a computer mouse)   Cut down on sweet beverages. This includes juice, soda, and sweet tea.     Drink at least 1 glass of water with each meal and aim for at least 8 glasses per day   Exercise at least 150 minutes every week.

## 2020-05-06 ENCOUNTER — Encounter: Payer: Self-pay | Admitting: Family Medicine

## 2020-08-22 NOTE — Progress Notes (Signed)
I, Joseph [name], LAT, ATC, am serving as scribe for Dr. [name].  Joseph [name] is a 35 y.o. male who presents to [facility] at [facility] today for f/u of L foot pain and low back pain.  He was last seen by Dr. Denyse [name] on 02/01/19 at [facility] for L plantar foot pain and was shown a HEP for plantar fasciitis.  Since then, pt reports L foot swelling x 2 weeks and recurrent low back pain x 2 months.    L foot:  L foot and ankle swelling x 2 weeks his foot and lower leg are swollen in the morning when he wakes up and are better as soon as he gets up and starts walking.  His wife is currently 8 months pregnant with twins and is sleeping in an upright position in the bed.  He is very tall and because the bed is somewhat upright his leg hangs over the end of the bed a few feet.  No significant calf pain or swelling.  -Aggravating factors: Nothing in particular -Treatments tried: Aleve  Low back pain: Recurring back pain x 2 months -Radiating pain: No -LE numbness/tingling: No -Aggravating factors: transitioning from sit-to-stand; lumbar flexion; getting into/out of the car -Treatments tried: Aleve; heat; Biofreeze  Diagnostic testing: L foot XR- 01/07/19  Pertinent review of systems: No fevers or chills  Relevant historical information: History of migraines   Exam:  BP 110/78 (BP Location: Right Arm, Patient Position: Sitting, Cuff Size: Large)   Pulse 90   Ht 6\' 5"  (1.956 m)   Wt (!) 348 lb 9.6 oz (158.1 kg)   SpO2 97%   BMI 41.34 kg/m  General: Well Developed, well nourished, and in no acute distress.   MSK: Left leg no significant edema.  Left foot pes planus.  Forefoot nontender no edema. Patient provided picture shows pedal edema in the morning.  L-spine nontender midline.  Decreased lumbar motion. Lower extremity strength is intact.     Assessment and Plan: 35 y.o. male with left foot swelling.  This occurs in the morning when he wakes up.  He is having to  sleep with his leg hanging off the edge of the bed by a few feet.  I suspect that he is having some calf compression as result of this causing poor venous return and foot swelling.  We problem solved how to resolve this issue.  Recommend either building effectively a platform off the end of the bed that he can have his foot supported.  Or he can try compression stocking support.  Or even possibly using a pillow underneath the calf to reduce the point pressure of the end of the bed.  Low back pain: Myofascial spasm and dysfunction.  Plan for physical therapy.  If not improving proceed with more imaging and further evaluation.   PDMP not reviewed this encounter. Orders Placed This Encounter  Procedures   Ambulatory referral to Physical Therapy    Referral Priority:   Routine    Referral Type:   Physical Medicine    Referral Reason:   Specialty Services Required    Requested Specialty:   Physical Therapy    Number of Visits Requested:   1   No orders of the defined types were placed in this encounter.    Discussed warning signs or symptoms. Please see discharge instructions. Patient expresses understanding.   The above documentation has been reviewed and is accurate and complete 31, M.D.

## 2020-08-23 ENCOUNTER — Encounter: Payer: Self-pay | Admitting: Family Medicine

## 2020-08-23 ENCOUNTER — Other Ambulatory Visit: Payer: Self-pay

## 2020-08-23 ENCOUNTER — Ambulatory Visit (INDEPENDENT_AMBULATORY_CARE_PROVIDER_SITE_OTHER): Payer: Self-pay | Admitting: Family Medicine

## 2020-08-23 VITALS — BP 110/78 | HR 90 | Ht 77.0 in | Wt 348.6 lb

## 2020-08-23 DIAGNOSIS — M545 Low back pain, unspecified: Secondary | ICD-10-CM

## 2020-08-23 DIAGNOSIS — R600 Localized edema: Secondary | ICD-10-CM

## 2020-08-23 NOTE — Patient Instructions (Addendum)
Thank you for coming in today.   I think the swelling is due to positional compression at night.   Try to create a situation where the calf is not compressed by the edge of the bed.   If that cannot happen try compression stocking at night.   If not much better or if worse let me know and I will order a DVT ultrasound test.   I think this can get better.   Plan for PT  Edema  Edema is when you have too much fluid in your body or under your skin. Edema may make your legs, feet, and ankles swell up. Swelling is also common in looser tissues, like around your eyes. This is a common condition. It gets more common as you get older. There are many possible causes of edema. Eating too much salt (sodium) and being on your feet or sitting for a long time can cause edema in yourlegs, feet, and ankles. Hot weather may make edema worse. Edema is usually painless. Your skin may look swollen or shiny. Follow these instructions at home: Keep the swollen body part raised (elevated) above the level of your heart when you are sitting or lying down. Do not sit still or stand for a long time. Do not wear tight clothes. Do not wear garters on your upper legs. Exercise your legs. This can help the swelling go down. Wear elastic bandages or support stockings as told by your doctor. Eat a low-salt (low-sodium) diet to reduce fluid as told by your doctor. Depending on the cause of your swelling, you may need to limit how much fluid you drink (fluid restriction). Take over-the-counter and prescription medicines only as told by your doctor. Contact a doctor if: Treatment is not working. You have heart, liver, or kidney disease and have symptoms of edema. You have sudden and unexplained weight gain. Get help right away if: You have shortness of breath or chest pain. You cannot breathe when you lie down. You have pain, redness, or warmth in the swollen areas. You have heart, liver, or kidney disease and get  edema all of a sudden. You have a fever and your symptoms get worse all of a sudden. Summary Edema is when you have too much fluid in your body or under your skin. Edema may make your legs, feet, and ankles swell up. Swelling is also common in looser tissues, like around your eyes. Raise (elevate) the swollen body part above the level of your heart when you are sitting or lying down. Follow your doctor's instructions about diet and how much fluid you can drink (fluid restriction). This information is not intended to replace advice given to you by your health care provider. Make sure you discuss any questions you have with your healthcare provider. Document Revised: 12/14/2019 Document Reviewed: 12/14/2019 Elsevier Patient Education  2022 [employer].

## 2020-09-04 ENCOUNTER — Encounter: Payer: Self-pay | Admitting: Family Medicine

## 2020-10-03 NOTE — Progress Notes (Deleted)
   I, [name], LAT, ATC, am serving as scribe for Dr. [name].  Joseph [name] is a 35 y.o. male who presents to [facility] at [facility] today for f/u of LBP.  He was last seen by Dr. [name] on 08/23/20 and was referred to PT for his LBP at [facility] of which he has completed 0 visits.  He also reported L foot and ankle swelling which was deemed to be due to calf compression, causing poor venous return.  Since his last visit, pt reports     Pertinent review of systems: ***  Relevant historical information: ***   Exam:  There were no vitals taken for this visit. General: Well Developed, well nourished, and in no acute distress.   MSK: ***    Lab and Radiology Results No results found for this or any previous visit (from the past 72 hour(s)). No results found.     Assessment and Plan: 35 y.o. male with ***   PDMP not reviewed this encounter. No orders of the defined types were placed in this encounter.  No orders of the defined types were placed in this encounter.    Discussed warning signs or symptoms. Please see discharge instructions. Patient expresses understanding.   ***

## 2020-10-04 ENCOUNTER — Ambulatory Visit: Payer: Self-pay | Admitting: Family Medicine

## 2020-12-11 IMAGING — CR DG CHEST 2V
1 series · 2 of 2 positions shown · non-contrast
Comparison: No pertinent prior exams available for comparison.

CLINICAL DATA: Chest pain.

EXAM:
CHEST - 2 VIEW

[Series 1: dg chest 2 view · 0.14mm/px · 2 of 2 slices shown]
[im 1/2]
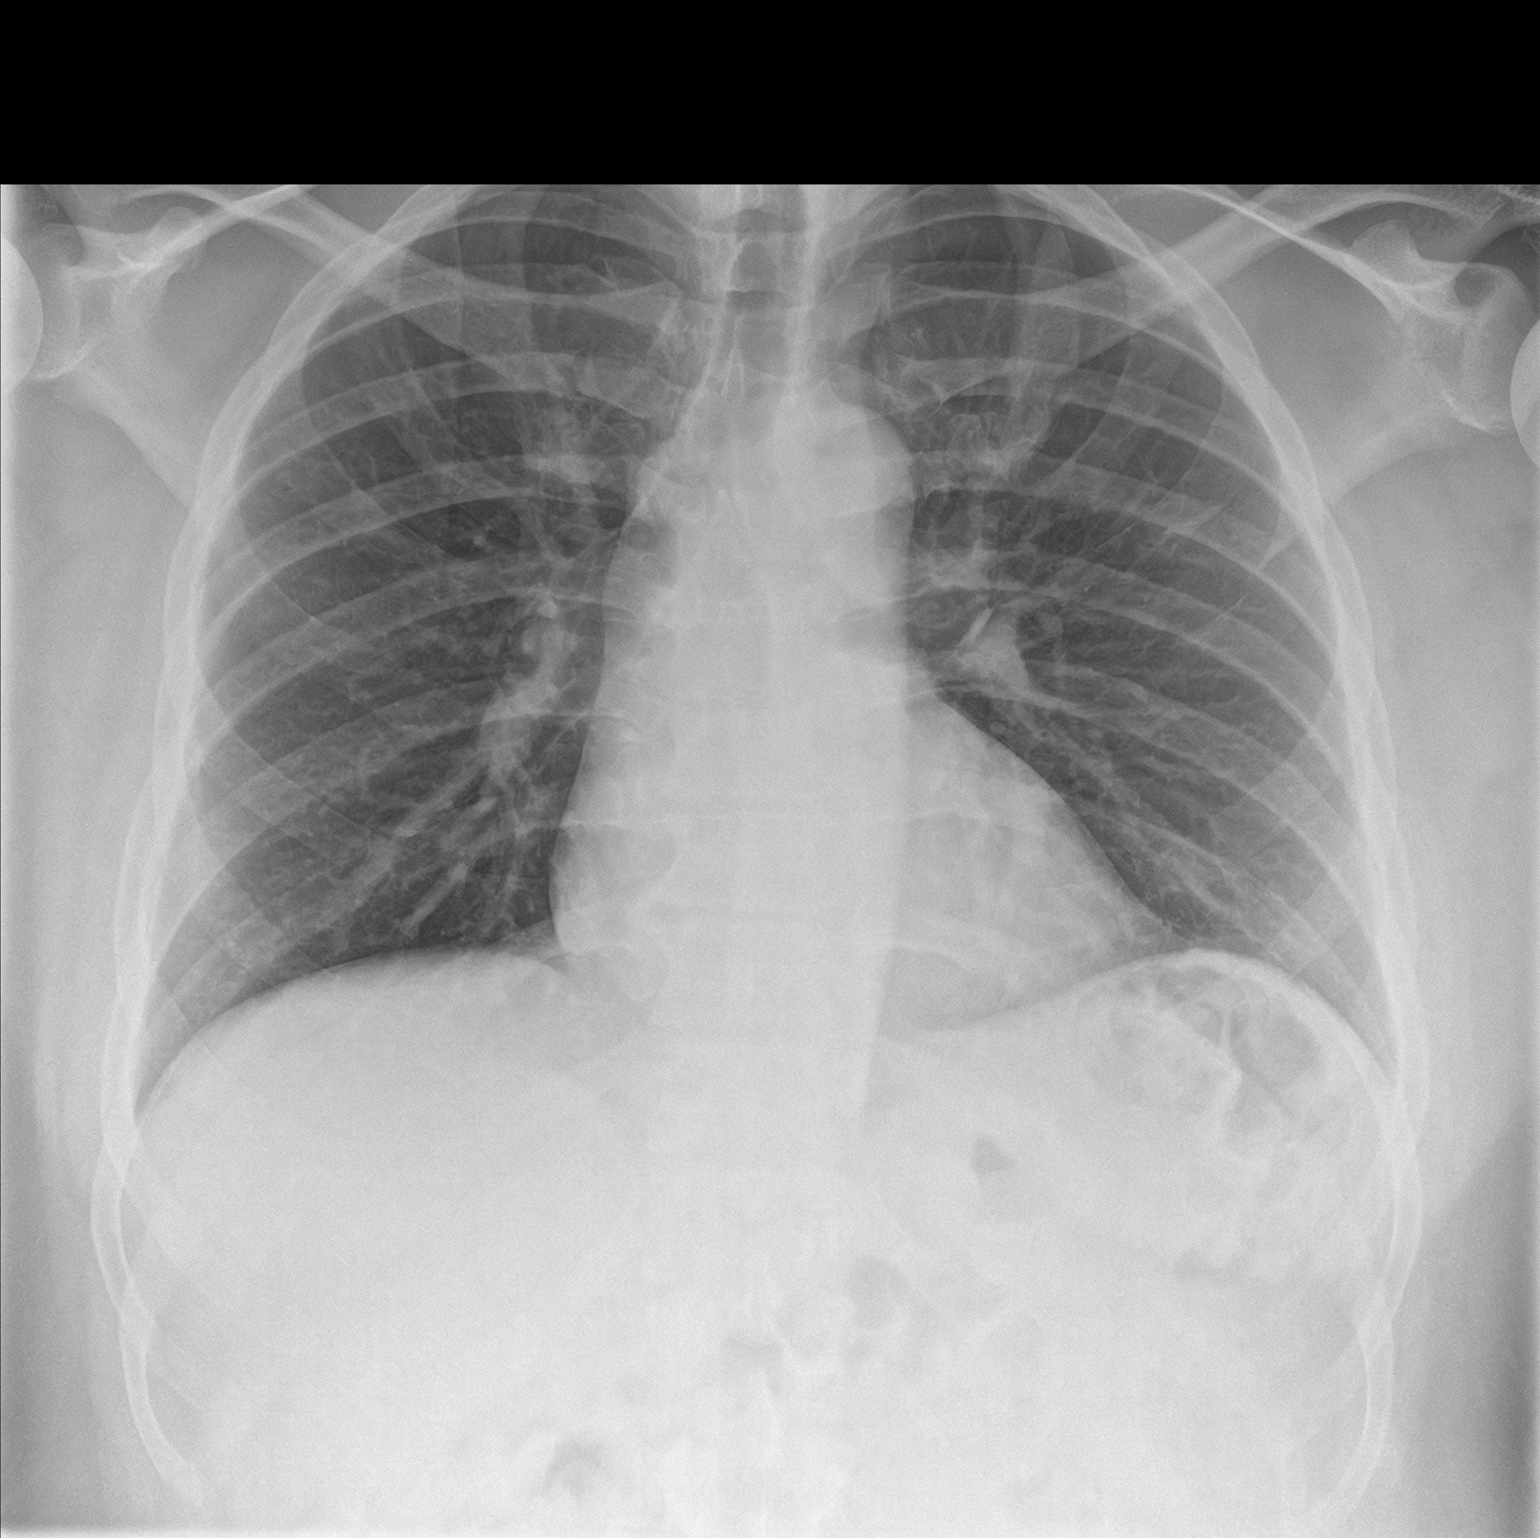
[im 2/2]
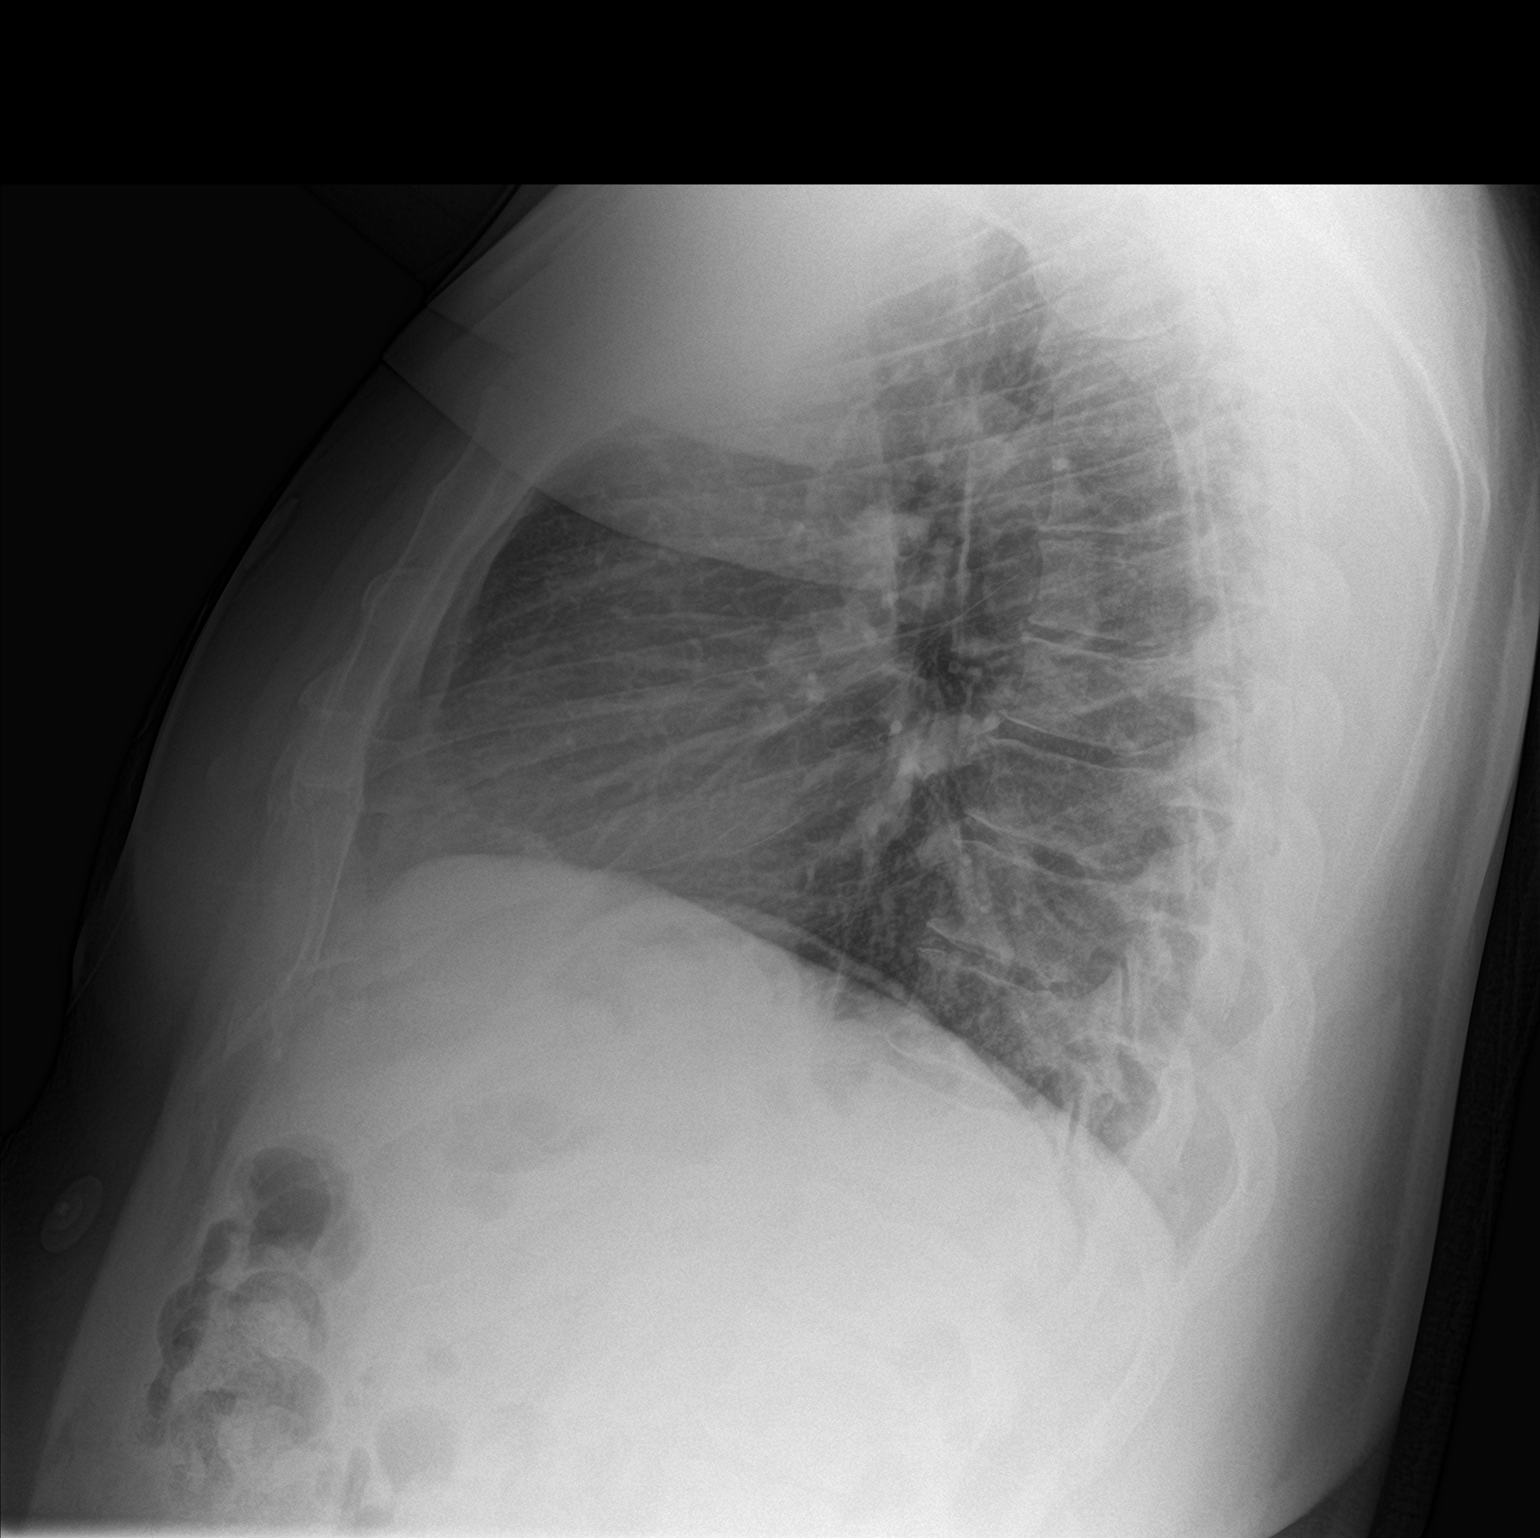

[2 of 2 positions shown; findings below may reference images not displayed]

FINDINGS: Heart size within normal limits.

No appreciable airspace consolidation.No evidence of pleural
effusion or pneumothorax.

No acute bony abnormality identified.
IMPRESSION: No evidence of acute cardiopulmonary abnormality.

## 2021-08-28 ENCOUNTER — Encounter: Payer: Self-pay | Admitting: Physician Assistant

## 2021-08-28 ENCOUNTER — Ambulatory Visit: Payer: BC Managed Care – PPO | Admitting: Physician Assistant

## 2021-08-28 VITALS — BP 121/80 | HR 81 | Temp 97.6°F | Ht 77.0 in | Wt 328.0 lb

## 2021-08-28 DIAGNOSIS — L853 Xerosis cutis: Secondary | ICD-10-CM | POA: Diagnosis not present

## 2021-08-28 DIAGNOSIS — R634 Abnormal weight loss: Secondary | ICD-10-CM | POA: Diagnosis not present

## 2021-08-28 DIAGNOSIS — L299 Pruritus, unspecified: Secondary | ICD-10-CM

## 2021-08-28 LAB — COMPREHENSIVE METABOLIC PANEL
ALT: 21 U/L (ref 0–53)
AST: 17 U/L (ref 0–37)
Albumin: 4.4 g/dL (ref 3.5–5.2)
Alkaline Phosphatase: 73 U/L (ref 39–117)
BUN: 15 mg/dL (ref 6–23)
CO2: 27 mEq/L (ref 19–32)
Calcium: 9.6 mg/dL (ref 8.4–10.5)
Chloride: 104 mEq/L (ref 96–112)
Creatinine, Ser: 0.96 mg/dL (ref 0.40–1.50)
GFR: 101.41 mL/min (ref >60.00)
Glucose, Bld: 91 mg/dL (ref 70–99)
Potassium: 4.1 mEq/L (ref 3.5–5.1)
Sodium: 138 mEq/L (ref 135–145)
Total Bilirubin: 0.8 mg/dL (ref 0.2–1.2)
Total Protein: 7.6 g/dL (ref 6.0–8.3)

## 2021-08-28 LAB — CBC WITH DIFFERENTIAL/PLATELET
Basophils Absolute: 0 10*3/uL (ref 0.0–0.1)
Basophils Relative: 0.4 % (ref 0.0–3.0)
Eosinophils Absolute: 0.1 10*3/uL (ref 0.0–0.7)
Eosinophils Relative: 3.1 % (ref 0.0–5.0)
HCT: 40.9 % (ref 39.0–52.0)
Hemoglobin: 13.4 g/dL (ref 13.0–17.0)
Lymphocytes Relative: 46.5 % — ABNORMAL HIGH (ref 12.0–46.0)
Lymphs Abs: 2.2 10*3/uL (ref 0.7–4.0)
MCHC: 32.8 g/dL (ref 30.0–36.0)
MCV: 89.5 fl (ref 78.0–100.0)
Monocytes Absolute: 0.4 10*3/uL (ref 0.1–1.0)
Monocytes Relative: 9.3 % (ref 3.0–12.0)
Neutro Abs: 1.9 10*3/uL (ref 1.4–7.7)
Neutrophils Relative %: 40.7 % — ABNORMAL LOW (ref 43.0–77.0)
Platelets: 281 10*3/uL (ref 150.0–400.0)
RBC: 4.57 Mil/uL (ref 4.22–5.81)
RDW: 13.3 % (ref 11.5–15.5)
WBC: 4.7 10*3/uL (ref 4.0–10.5)

## 2021-08-28 LAB — TSH: TSH: 0.88 u[IU]/mL (ref 0.35–5.50)

## 2021-08-28 LAB — HEMOGLOBIN A1C: Hgb A1c MFr Bld: 5.9 % (ref 4.6–6.5)

## 2021-08-28 LAB — VITAMIN B12: Vitamin B-12: 148 pg/mL — ABNORMAL LOW (ref 211–911)

## 2021-08-28 MED ORDER — TRIAMCINOLONE ACETONIDE 0.1 % EX CREA: 1.0000 | TOPICAL_CREAM | Freq: Two times a day (BID) | CUTANEOUS | 0 refills | Status: DC

## 2021-08-28 NOTE — Patient Instructions (Addendum)
Good to meet you today! Let's check labs and make sure all looks ok there. Use the triamcinolone cream twice daily on affected areas - wash hands after use; use for up to 2 weeks at a time and then sparingly after that. Stay moisturized and well hydrated.   Enjoy your babies!! Congrats!

## 2021-08-28 NOTE — Progress Notes (Signed)
Subjective:    Patient ID: Joseph [name], male    DOB: 01-06-1986, 36 y.o.   MRN: [id number]  Chief Complaint  Patient presents with   Pruritis    All over    Tingling    On feet and fingers     HPI Patient is in today for ?all over itching / tingling. Worst on posterior shoulder, knees, legs, ankles, feet, backs of hands.  Started last Thursday (6 days ago)- itching after sweating at work, went on through the weekend. Sometimes feet feel warm / tingling. Some dark spots on legs and feet. No fatigue. Less migraines recently. Feels normal overall otherwise.   One month ago similar episode - lasted 3 days, couldn't sleep, needed Benadryl  Reports having very dry hands through winter months; hasn't ever seen a provider about it but has thought of it.  Lost 20 lbs in the last year, says he has twin 11 mo babies at home; carrying up and down stairs; says he hasn't intentionally been trying to lose though.   Past Medical History:  Diagnosis Date   Asthma    GERD (gastroesophageal reflux disease)     History reviewed. No pertinent surgical history.  Family History  Problem Relation Age of Onset   Hypertension Mother    Fibromyalgia Mother    Hypertension Father    Diabetes Father     Social History   Tobacco Use   Smoking status: Never    Passive exposure: Yes   Smokeless tobacco: Never  Substance Use Topics   Alcohol use: Never   Drug use: Never     No Known Allergies  Review of Systems NEGATIVE UNLESS OTHERWISE INDICATED IN HPI      Objective:     BP 121/80   Pulse 81   Temp 97.6 F (36.4 C) (Temporal)   Ht 6\' 5"  (1.956 m)   Wt (!) 328 lb (148.8 kg)   SpO2 99%   BMI 38.90 kg/m   Wt Readings from Last 3 Encounters:  08/28/21 (!) 328 lb (148.8 kg)  08/23/20 (!) 348 lb 9.6 oz (158.1 kg)  04/20/20 (!) 340 lb (154.2 kg)    BP Readings from Last 3 Encounters:  08/28/21 121/80  08/23/20 110/78  04/20/20 113/74     Physical Exam Vitals and  nursing note reviewed.  Constitutional:      General: He is not in acute distress.    Appearance: Normal appearance. He is not toxic-appearing.  HENT:     Head: Normocephalic and atraumatic.     Right Ear: Tympanic membrane, ear canal and external ear normal.     Left Ear: Tympanic membrane, ear canal and external ear normal.     Nose: Nose normal.     Mouth/Throat:     Mouth: Mucous membranes are moist.     Pharynx: Oropharynx is clear.  Eyes:     Extraocular Movements: Extraocular movements intact.     Conjunctiva/sclera: Conjunctivae normal.     Pupils: Pupils are equal, round, and reactive to light.  Cardiovascular:     Rate and Rhythm: Normal rate and regular rhythm.     Pulses: Normal pulses.     Heart sounds: Normal heart sounds.  Pulmonary:     Effort: Pulmonary effort is normal.     Breath sounds: Normal breath sounds.  Musculoskeletal:        General: Normal range of motion.     Cervical back: Normal range of motion and neck supple.  Skin:    General: Skin is warm and dry.     Findings: No rash.     Comments: Dryness, flaking, some hypopigmented changes around knuckles / posterior hands; dryness of feet and ankles  Neurological:     General: No focal deficit present.     Mental Status: He is alert and oriented to person, place, and time.  Psychiatric:        Mood and Affect: Mood normal.        Behavior: Behavior normal.        Assessment & Plan:   Problem List Items Addressed This Visit   None Visit Diagnoses     Pruritus    -  Primary   Relevant Orders   CBC with Differential/Platelet   Comprehensive metabolic panel   Hemoglobin A1c   TSH   Vitamin B12   Dry skin       Abnormal weight loss       Relevant Orders   CBC with Differential/Platelet   Comprehensive metabolic panel   Hemoglobin A1c   TSH   Vitamin B12        Meds ordered this encounter  Medications   triamcinolone cream (KENALOG) 0.1 %    Sig: Apply 1 Application topically 2  (two) times daily.    Dispense:  80 g    Refill:  0    Order Specific Question:   Supervising Provider    Answer:   Tana Conch O [[id number]]   1. Pruritus 2. Dry skin -Suspect underlying undiagnosed skin issues such as eczema -Triamcinolone cream and moisturize as directed  3. Abnormal weight loss -Suspect 2/2 new stress of having twins -Plan to check basic labs, treat if indicated  -Regular f/up with PCP    Return if symptoms worsen or fail to improve.  This note was prepared with assistance of Conservation officer, historic buildings. Occasional wrong-word or sound-a-like substitutions may have occurred due to the inherent limitations of voice recognition software.    Laretha Luepke M Rillie Riffel, PA-C

## 2021-08-30 ENCOUNTER — Encounter: Payer: Self-pay | Admitting: Physician Assistant

## 2021-08-30 DIAGNOSIS — E538 Deficiency of other specified B group vitamins: Secondary | ICD-10-CM

## 2021-09-02 MED ORDER — VITAMIN B-12 1000 MCG PO TABS: 1000.0000 ug | ORAL_TABLET | Freq: Every day | ORAL | 0 refills | Status: DC

## 2021-09-12 ENCOUNTER — Encounter: Payer: Self-pay | Admitting: Family Medicine

## 2021-09-12 ENCOUNTER — Ambulatory Visit: Payer: BC Managed Care – PPO | Admitting: Family Medicine

## 2021-09-12 DIAGNOSIS — I8390 Asymptomatic varicose veins of unspecified lower extremity: Secondary | ICD-10-CM | POA: Insufficient documentation

## 2021-09-12 DIAGNOSIS — E538 Deficiency of other specified B group vitamins: Secondary | ICD-10-CM | POA: Diagnosis not present

## 2021-09-12 DIAGNOSIS — L299 Pruritus, unspecified: Secondary | ICD-10-CM | POA: Diagnosis not present

## 2021-09-12 NOTE — Assessment & Plan Note (Addendum)
He has been using B12 supplementation which works well.  He likely has some underlying xerosis cutis.  Recommended continuing daily emollients.  Also recommended over-the-counter second-generation antihistamine such as Claritin or Zyrtec.  He will let me know if not improving.  He has triamcinolone to use as needed for inflamed areas.

## 2021-09-12 NOTE — Assessment & Plan Note (Signed)
Reassured patient.  No red flags.  Discussed conservative measures including leg elevation and compression stockings.

## 2021-09-12 NOTE — Progress Notes (Signed)
   Joseph [name] is a 36 y.o. male who presents today for an office visit.  Assessment/Plan:  Chronic Problems Addressed Today: B12 deficiency Currently on B12 supplementation 1000 mcg daily.  We can recheck B 12 level in 6 to 12 months.  Varicose vein of leg Reassured patient.  No red flags.  Discussed conservative measures including leg elevation and compression stockings.  Pruritus He has been using B12 supplementation which works well.  He likely has some underlying xerosis cutis.  Recommended continuing daily emollients.  Also recommended over-the-counter second-generation antihistamine such as Claritin or Zyrtec.  He will let me know if not improving.  He has triamcinolone to use as needed for inflamed areas.     Subjective:  HPI:  Patient here for follow up. He was seen here by a different provider about 2 weeks ago for pruritis. He was recommend to start a B12 supplement after blood work showed low levels. This has seemed to helped. He has tried moisturizing cream. Trying to stay hydrated.   He is also noticed enlarged veins in bilateral legs.  He just recently started noticing them though he thinks they have been there for several months.  No pain.       Objective:  Physical Exam: BP 120/80   Pulse 78   Temp 97.9 F (36.6 C) (Temporal)   Ht 6\' 5"  (1.956 m)   Wt (!) 330 lb 9.6 oz (150 kg)   SpO2 99%   BMI 39.20 kg/m    Wt Readings from Last 3 Encounters:  09/12/21 (!) 330 lb 9.6 oz (150 kg)  08/28/21 (!) 328 lb (148.8 kg)  08/23/20 (!) 348 lb 9.6 oz (158.1 kg)  Gen: No acute distress, resting comfortably CV: Regular rate and rhythm with no murmurs appreciated Pulm: Normal work of breathing, clear to auscultation bilaterally with no crackles, wheezes, or rhonchi MSK: Varicose veins in bilateral legs noted.  Neuro: Grossly normal, moves all extremities Psych: Normal affect and thought content      Liany Mumpower M. 08/25/20, MD 09/12/2021 9:28 AM

## 2021-09-12 NOTE — Assessment & Plan Note (Signed)
Currently on B12 supplementation 1000 mcg daily.  We can recheck B 12 level in 6 to 12 months.

## 2021-09-12 NOTE — Patient Instructions (Signed)
It was very nice to see you today!  You can try using Claritin or Zyrtec to help with itching.  Please continue using daily lotion.  You have varicose veins.  This is a benign issue that can sometimes be hereditary.  Please try to keep your legs elevated when you are sitting.  You can use compression stockings to help with this as well.  Let me know if they become painful.  Take care, Dr Jimmey Ralph  PLEASE NOTE:  If you had any lab tests please let us know if you have not heard back within a few days. You may see your results on mychart before we have a chance to review them but we will give you a call once they are reviewed by [place]. If we ordered any referrals today, please let us know if you have not heard from their office within the next week.   Please try these tips to maintain a healthy lifestyle:  Eat at least 3 REAL meals and 1-2 snacks per day.  Aim for no more than 5 hours between eating.  If you eat breakfast, please do so within one hour of getting up.   Each meal should contain half fruits/vegetables, one quarter protein, and one quarter carbs (no bigger than a computer mouse)  Cut down on sweet beverages. This includes juice, soda, and sweet tea.   Drink at least 1 glass of water with each meal and aim for at least 8 glasses per day  Exercise at least 150 minutes every week.

## 2021-09-16 ENCOUNTER — Ambulatory Visit: Payer: BC Managed Care – PPO | Admitting: Family Medicine

## 2021-09-23 NOTE — Progress Notes (Unsigned)
   I, Joseph [name], LAT, ATC, am serving as scribe for Dr. [name].  Joseph [name] is a 36 y.o. male who presents to [facility] at [facility] today for low back pain.  He was last seen by Dr. Denyse [name] on 08/23/20 and was referred to PT but never completed any visits.  Today, pt reports low back pain never really resolved. Pt was unable to start PT due to the birth of his twins. Pt locates pain to to the R-side of his low back and sometimes across both sides.   Pertinent review of systems: No fevers or chills  Relevant historical information: GERD.  Obesity. Patient lives in [place] now.  Is the chief archivist at [employer]  Exam:  BP 112/82   Pulse 76   Ht 6\' 5"  (1.956 m)   Wt (!) 334 lb 3.2 oz (151.6 kg)   SpO2 98%   BMI 39.63 kg/m  General: Well Developed, well nourished, and in no acute distress.   MSK: L-spine: Nontender midline. Decreased lumbar motion to flexion. Lower extremity strength is intact. Reflexes are intact.    Lab and Radiology Results  X-ray images L-spine obtained today personally and independently interpreted. Mild DDD.  Mild facet DJD.  No acute fractures. Await formal radiology review     Assessment and Plan: 36 y.o. male with chronic low back pain.  Currently having an acute exacerbation.  Plan for physical therapy.  Additionally tizanidine at bedtime and a heating pad may be helpful.  Plan for x-ray today.  Recheck in 2 months especially if not improved.   PDMP not reviewed this encounter. Orders Placed This Encounter  Procedures   DG Lumbar Spine 2-3 Views    Standing Status:   Future    Number of Occurrences:   1    Standing Expiration Date:   10/25/2021    Order Specific Question:   Reason for Exam (SYMPTOM  OR DIAGNOSIS REQUIRED)    Answer:   low back pain    Order Specific Question:   Preferred imaging location?    Answer:   10/27/2021   Ambulatory referral to Physical Therapy    Referral Priority:    Routine    Referral Type:   Physical Medicine    Referral Reason:   Specialty Services Required    Requested Specialty:   Physical Therapy    Number of Visits Requested:   1   No orders of the defined types were placed in this encounter.    Discussed warning signs or symptoms. Please see discharge instructions. Patient expresses understanding.   The above documentation has been reviewed and is accurate and complete [name], M.D.

## 2021-09-24 ENCOUNTER — Ambulatory Visit: Payer: BC Managed Care – PPO | Admitting: Family Medicine

## 2021-09-24 ENCOUNTER — Ambulatory Visit (INDEPENDENT_AMBULATORY_CARE_PROVIDER_SITE_OTHER): Payer: BC Managed Care – PPO

## 2021-09-24 VITALS — BP 112/82 | HR 76 | Ht 77.0 in | Wt 334.2 lb

## 2021-09-24 DIAGNOSIS — M545 Low back pain, unspecified: Secondary | ICD-10-CM | POA: Diagnosis not present

## 2021-09-24 MED ORDER — TIZANIDINE HCL 4 MG PO TABS: 4.0000 mg | ORAL_TABLET | Freq: Three times a day (TID) | ORAL | 1 refills | Status: AC | PRN

## 2021-09-24 NOTE — Patient Instructions (Addendum)
Thank you for coming in today.   Please get an Xray today before you leave   I've referred you to Physical Therapy.  Let us know if you don't hear from them in one week.   Check back in 8 weeks 

## 2021-09-26 NOTE — Progress Notes (Signed)
Lumbar spine x-ray shows mild arthritis changes.

## 2021-10-07 ENCOUNTER — Ambulatory Visit (INDEPENDENT_AMBULATORY_CARE_PROVIDER_SITE_OTHER): Payer: BC Managed Care – PPO | Admitting: Family Medicine

## 2021-10-07 ENCOUNTER — Encounter: Payer: Self-pay | Admitting: Family Medicine

## 2021-10-07 VITALS — BP 117/87 | HR 78 | Temp 98.2°F | Ht 77.0 in | Wt 325.6 lb

## 2021-10-07 DIAGNOSIS — E538 Deficiency of other specified B group vitamins: Secondary | ICD-10-CM

## 2021-10-07 DIAGNOSIS — L299 Pruritus, unspecified: Secondary | ICD-10-CM

## 2021-10-07 DIAGNOSIS — R739 Hyperglycemia, unspecified: Secondary | ICD-10-CM | POA: Insufficient documentation

## 2021-10-07 DIAGNOSIS — Z13220 Encounter for screening for lipoid disorders: Secondary | ICD-10-CM

## 2021-10-07 DIAGNOSIS — Z0001 Encounter for general adult medical examination with abnormal findings: Secondary | ICD-10-CM

## 2021-10-07 DIAGNOSIS — N419 Inflammatory disease of prostate, unspecified: Secondary | ICD-10-CM

## 2021-10-07 LAB — CBC
HCT: 40 % (ref 39.0–52.0)
Hemoglobin: 13.2 g/dL (ref 13.0–17.0)
MCHC: 33 g/dL (ref 30.0–36.0)
MCV: 88.6 fl (ref 78.0–100.0)
Platelets: 299 10*3/uL (ref 150.0–400.0)
RBC: 4.51 Mil/uL (ref 4.22–5.81)
RDW: 13.2 % (ref 11.5–15.5)
WBC: 5.5 10*3/uL (ref 4.0–10.5)

## 2021-10-07 LAB — TSH: TSH: 0.85 u[IU]/mL (ref 0.35–5.50)

## 2021-10-07 NOTE — Assessment & Plan Note (Signed)
Last A1c 5.9.  Too early to recheck.  He will continue lifestyle modifications and we can recheck at next office visit.

## 2021-10-07 NOTE — Progress Notes (Signed)
Chief Complaint:  Joseph [name] is a 36 y.o. male who presents today for his annual comprehensive physical exam.    Assessment/Plan:  New/Acute Problems: Prostatitis No red flags.  He is already on Cipro started by urgent care.  He will complete his course of antibiotics and let us know if symptoms or not improving.  Chronic Problems Addressed Today: Hyperglycemia Last A1c 5.9.  Too early to recheck.  He will continue lifestyle modifications and we can recheck at next office visit.  B12 deficiency He is on 1000 mcg daily.  He started this about a month ago.  Will recheck B12 in 6 to 12 months.  Pruritus Doing much better with over-the-counter allergy medications.  Preventative Healthcare: Check labs.  Patient Counseling(The following topics were reviewed and/or handout was given):  -Nutrition: Stressed importance of moderation in sodium/caffeine intake, saturated fat and cholesterol, caloric balance, sufficient intake of fresh fruits, vegetables, and fiber.  -Stressed the importance of regular exercise.   -Substance Abuse: Discussed cessation/primary prevention of tobacco, alcohol, or other drug use; driving or other dangerous activities under the influence; availability of treatment for abuse.   -Injury prevention: Discussed safety belts, safety helmets, smoke detector, smoking near bedding or upholstery.   -Sexuality: Discussed sexually transmitted diseases, partner selection, use of condoms, avoidance of unintended pregnancy and contraceptive alternatives.   -Dental health: Discussed importance of regular tooth brushing, flossing, and dental visits.  -Health maintenance and immunizations reviewed. Please refer to Health maintenance section.  Return to care in 1 year for next preventative visit.     Subjective:  HPI:  See A/p for status of chronic conditions.  He went to urgent care yesterday due to urinary urgency and hesitancy. Also had some body aches and fatigue. Was  diagnosed with prostatitis at urgent care and started on cipro. HE has been on this for a day but has not noticed much change as of yet.   Lifestyle Diet: Cutting down on sugar.  Exercise: Very busy with work.      10/07/2021    9:45 AM  Depression screen PHQ 2/9  Decreased Interest 0  Down, Depressed, Hopeless 0  PHQ - 2 Score 0   ROS: Per HPI, otherwise a complete review of systems was negative.   PMH:  The following were reviewed and entered/updated in epic: Past Medical History:  Diagnosis Date   Asthma    GERD (gastroesophageal reflux disease)    Patient Active Problem List   Diagnosis Date Noted   Hyperglycemia 10/07/2021   Pruritus 09/12/2021   Varicose vein of leg 09/12/2021   B12 deficiency 09/12/2021   Gastroesophageal reflux disease 01/07/2019   Chronic migraine 01/07/2019   History reviewed. No pertinent surgical history.  Family History  Problem Relation Age of Onset   Hypertension Mother    Fibromyalgia Mother    Hypertension Father    Diabetes Father     Medications- reviewed and updated Current Outpatient Medications  Medication Sig Dispense Refill   ciprofloxacin (CIPRO) 500 MG tablet Take 500 mg by mouth 2 (two) times daily.     tiZANidine (ZANAFLEX) 4 MG tablet Take 1 tablet (4 mg total) by mouth every 8 (eight) hours as needed for muscle spasms. 30 tablet 1   vitamin B-12 (CYANOCOBALAMIN) 1000 MCG tablet Take 1 tablet (1,000 mcg total) by mouth daily. 90 tablet 0   No current facility-administered medications for this visit.    Allergies-reviewed and updated No Known Allergies  Social History   Socioeconomic  History   Marital status: Married    Spouse name: Not on file   Number of children: Not on file   Years of education: Not on file   Highest education level: Not on file  Occupational History   Not on file  Tobacco Use   Smoking status: Never    Passive exposure: Yes   Smokeless tobacco: Never  Substance and Sexual Activity    Alcohol use: Never   Drug use: Never   Sexual activity: Not on file  Other Topics Concern   Not on file  Social History Narrative   Not on file   Social Determinants of Health   Financial Resource Strain: Not on file  Food Insecurity: Not on file  Transportation Needs: Not on file  Physical Activity: Not on file  Stress: Not on file  Social Connections: Not on file        Objective:  Physical Exam: BP 117/87   Pulse 78   Temp 98.2 F (36.8 C) (Temporal)   Ht 6\' 5"  (1.956 m)   Wt (!) 325 lb 9.6 oz (147.7 kg)   SpO2 99%   BMI 38.61 kg/m   Body mass index is 38.61 kg/m. Wt Readings from Last 3 Encounters:  10/07/21 (!) 325 lb 9.6 oz (147.7 kg)  09/24/21 (!) 334 lb 3.2 oz (151.6 kg)  09/12/21 (!) 330 lb 9.6 oz (150 kg)   Gen: NAD, resting comfortably HEENT: TMs normal bilaterally. OP clear. No thyromegaly noted.  CV: RRR with no murmurs appreciated Pulm: NWOB, CTAB with no crackles, wheezes, or rhonchi GI: Normal bowel sounds present. Soft, Nontender, Nondistended. MSK: no edema, cyanosis, or clubbing noted Skin: warm, dry Neuro: CN2-12 grossly intact. Strength 5/5 in upper and lower extremities. Reflexes symmetric and intact bilaterally.  Psych: Normal affect and thought content     Tilden Broz M. 09/14/21, MD 10/07/2021 10:16 AM

## 2021-10-07 NOTE — Patient Instructions (Signed)
It was very nice to see you today!  Please continue your Cipro.  Let us know if your symptoms not improving.  Will check blood work today.  Please come back to see me in 1 year for your next physical.  Come back to see me sooner if needed.  Take care, Dr Jimmey Ralph  PLEASE NOTE:  If you had any lab tests please let us know if you have not heard back within a few days. You may see your results on mychart before we have a chance to review them but we will give you a call once they are reviewed by [place]. If we ordered any referrals today, please let us know if you have not heard from their office within the next week.   Please try these tips to maintain a healthy lifestyle:  Eat at least 3 REAL meals and 1-2 snacks per day.  Aim for no more than 5 hours between eating.  If you eat breakfast, please do so within one hour of getting up.   Each meal should contain half fruits/vegetables, one quarter protein, and one quarter carbs (no bigger than a computer mouse)  Cut down on sweet beverages. This includes juice, soda, and sweet tea.   Drink at least 1 glass of water with each meal and aim for at least 8 glasses per day  Exercise at least 150 minutes every week.     Preventive Care 10-37 Years Old, Male Preventive care refers to lifestyle choices and visits with your health care provider that can promote health and wellness. Preventive care visits are also called wellness exams. What can I expect for my preventive care visit? Counseling During your preventive care visit, your health care provider may ask about your: Medical history, including: Past medical problems. Family medical history. Current health, including: Emotional well-being. Home life and relationship well-being. Sexual activity. Lifestyle, including: Alcohol, nicotine or tobacco, and drug use. Access to firearms. Diet, exercise, and sleep habits. Safety issues such as seatbelt and bike helmet use. Sunscreen use. Work and  work Astronomer. Physical exam Your health care provider may check your: Height and weight. These may be used to calculate your BMI (body mass index). BMI is a measurement that tells if you are at a healthy weight. Waist circumference. This measures the distance around your waistline. This measurement also tells if you are at a healthy weight and may help predict your risk of certain diseases, such as type 2 diabetes and high blood pressure. Heart rate and blood pressure. Body temperature. Skin for abnormal spots. What immunizations do I need?  Vaccines are usually given at various ages, according to a schedule. Your health care provider will recommend vaccines for you based on your age, medical history, and lifestyle or other factors, such as travel or where you work. What tests do I need? Screening Your health care provider may recommend screening tests for certain conditions. This may include: Lipid and cholesterol levels. Diabetes screening. This is done by checking your blood sugar (glucose) after you have not eaten for a while (fasting). Hepatitis B test. Hepatitis C test. HIV (human immunodeficiency virus) test. STI (sexually transmitted infection) testing, if you are at risk. Talk with your health care provider about your test results, treatment options, and if necessary, the need for more tests. Follow these instructions at home: Eating and drinking  Eat a healthy diet that includes fresh fruits and vegetables, whole grains, lean protein, and low-fat dairy products. Drink enough fluid to keep  your urine pale yellow. Take vitamin and mineral supplements as recommended by your health care provider. Do not drink alcohol if your health care provider tells you not to drink. If you drink alcohol: Limit how much you have to 0-2 drinks a day. Know how much alcohol is in your drink. In the [place] one drink equals one 12 oz bottle of beer (355 mL), one 5 oz glass of wine (148 mL), or one  1 oz glass of hard liquor (44 mL). Lifestyle Brush your teeth every morning and night with fluoride toothpaste. Floss one time each day. Exercise for at least 30 minutes 5 or more days each week. Do not use any products that contain nicotine or tobacco. These products include cigarettes, chewing tobacco, and vaping devices, such as e-cigarettes. If you need help quitting, ask your health care provider. Do not use drugs. If you are sexually active, practice safe sex. Use a condom or other form of protection to prevent STIs. Find healthy ways to manage stress, such as: Meditation, yoga, or listening to music. Journaling. Talking to a trusted person. Spending time with friends and family. Minimize exposure to UV radiation to reduce your risk of skin cancer. Safety Always wear your seat belt while driving or riding in a vehicle. Do not drive: If you have been drinking alcohol. Do not ride with someone who has been drinking. If you have been using any mind-altering substances or drugs. While texting. When you are tired or distracted. Wear a helmet and other protective equipment during sports activities. If you have firearms in your house, make sure you follow all gun safety procedures. Seek help if you have been physically or sexually abused. What's next? Go to your health care provider once a year for an annual wellness visit. Ask your health care provider how often you should have your eyes and teeth checked. Stay up to date on all vaccines. This information is not intended to replace advice given to you by your health care provider. Make sure you discuss any questions you have with your health care provider. Document Revised: 08/15/2020 Document Reviewed: 08/15/2020 Elsevier Patient Education  2023 [employer].

## 2021-10-07 NOTE — Assessment & Plan Note (Signed)
He is on 1000 mcg daily.  He started this about a month ago.  Will recheck B12 in 6 to 12 months.

## 2021-10-07 NOTE — Assessment & Plan Note (Signed)
Doing much better with over-the-counter allergy medications.

## 2021-10-08 LAB — COMPREHENSIVE METABOLIC PANEL
ALT: 29 U/L (ref 0–53)
AST: 21 U/L (ref 0–37)
Albumin: 4.2 g/dL (ref 3.5–5.2)
Alkaline Phosphatase: 75 U/L (ref 39–117)
BUN: 12 mg/dL (ref 6–23)
CO2: 24 mEq/L (ref 19–32)
Calcium: 9.4 mg/dL (ref 8.4–10.5)
Chloride: 100 mEq/L (ref 96–112)
Creatinine, Ser: 1 mg/dL (ref 0.40–1.50)
GFR: 96.49 mL/min (ref >60.00)
Glucose, Bld: 80 mg/dL (ref 70–99)
Potassium: 4.4 mEq/L (ref 3.5–5.1)
Sodium: 143 mEq/L (ref 135–145)
Total Bilirubin: 0.4 mg/dL (ref 0.2–1.2)
Total Protein: 7.7 g/dL (ref 6.0–8.3)

## 2021-10-08 LAB — LIPID PANEL
Cholesterol: 126 mg/dL (ref 0–200)
HDL: 41 mg/dL (ref >39.00)
LDL Cholesterol: 72 mg/dL (ref 0–99)
NonHDL: 84.86
Total CHOL/HDL Ratio: 3
Triglycerides: 65 mg/dL (ref 0.0–149.0)
VLDL: 13 mg/dL (ref 0.0–40.0)

## 2021-10-08 NOTE — Progress Notes (Signed)
Please inform patient of the following:  Great news!  Labs are all stable.  We can recheck in a year.

## 2021-10-11 ENCOUNTER — Telehealth: Payer: Self-pay | Admitting: Family Medicine

## 2021-10-11 NOTE — Telephone Encounter (Addendum)
Patient states: - Since starting Cipro 500 mg twice daily he has been experiencing lightheadedness and "heart flutters".  - Lightheadedness occurs in the morning and while driving - Believes he may be having a reaction to the medication - Wanted to know if PCP recommended something else  Due to symptoms I have sent pt to triage for further evaluation. I was informed that due to wait times, a triage nurse would be able to call patient back for evaluation.

## 2021-10-11 NOTE — Telephone Encounter (Signed)
Pt has an appt with [facility] on 10/14/21 at 9am. Needing to know if he should stop medication.    Patient Name: Joseph [name] Gender: Male DOB: 10-14-1985 Age: 36 Y 6 D Return Phone Number: [phone] (Primary) Address: City/ State/ Zip: [place]  [address] Client [facility] - Administrator, [facility] Provider Jacquiline Doe- MD Contact Type Call Who Is Calling Patient / Member / Family / Caregiver Call Type Triage / Clinical Caller Name Hazlyn Relationship To Patient Other Return Phone Number [phone] (Primary) Chief Complaint Heart palpitations or irregular heartbeat Reason for Call Symptomatic / Request for Health Information Initial Comment Caller states she is calling from [facility] and has a patient that thinks he may be having a medication reaction. Translation No Nurse Assessment Nurse: Agricultural engineer, RN, Cala Bradford Date/Time (Eastern Time): 10/11/2021 3:11:57 PM Confirm and document reason for call. If symptomatic, describe symptoms. ---Caller states he is feeling occasional "flutters" in his chest and "occasional headiness". stated he started taking Cipro for Dx of prostatitis last Sunday gotten from an UC, S/S started Monday. Is on day 6/10. Does the patient have any new or worsening symptoms? ---Yes Will a triage be completed? ---Yes Related visit to physician within the last 2 weeks? ---Yes Does the PT have any chronic conditions? (i.e. diabetes, asthma, this includes High risk factors for pregnancy, etc.) ---No Is this a behavioral health or substance abuse call? ---No Guidelines Guideline Title Affirmed Question Affirmed Notes Nurse Date/Time (Eastern Time) Heart Rate and Heartbeat Questions [1] Palpitations AND [2] no improvement after using Care Advice Schreffler, RN, Cala Bradford 10/11/2021 3:19:21 PM Disp. Time Lamount Cohen Time) Disposition Final User 10/11/2021  3:29:04 PM SEE PCP WITHIN 3 DAYS Yes Schreffler, RN, Cala Bradford Final Disposition 10/11/2021 3:29:04 PM SEE PCP WITHIN 3 DAYS Yes Schreffler, RN, Anders Simmonds Disagree/Comply Comply Caller Understands Yes PreDisposition Did not know what to do Care Advice Given Per Guideline * You need to be seen within 2 or 3 days. SEE PCP WITHIN 3 DAYS: * PCP VISIT: Call your doctor (or NP/PA) during regular office hours and make an appointment. A clinic or urgent care center are good places to go for care if your doctor's office is closed or you can't get an appointment. NOTE: If office will be open tomorrow, tell caller to call then, not in 3 days. CALL BACK IF: * Chest pain, lightheadedness or difficulty breathing occurs * You become worse CARE ADVICE given per Heart Rate and Heartbeat Questions (Adult) guideline. Comments User: Morrie Sheldon, RN Date/Time Lamount Cohen Time): 10/11/2021 3:30:23 PM Advised caller to contact PCP for an appointment to be seen today and discuss ABX treatment. Caller verbalized understanding Referrals REFERRED TO PCP OFFICE

## 2021-10-14 ENCOUNTER — Ambulatory Visit: Payer: BC Managed Care – PPO | Admitting: Family

## 2021-10-14 NOTE — Telephone Encounter (Signed)
This was from 3 days ago and he is seeing stephanie this morning for this.

## 2021-10-14 NOTE — Telephone Encounter (Signed)
See note

## 2021-11-07 ENCOUNTER — Encounter: Payer: Self-pay | Admitting: Family Medicine

## 2021-11-07 ENCOUNTER — Ambulatory Visit: Payer: BC Managed Care – PPO | Admitting: Family Medicine

## 2021-11-07 VITALS — BP 115/79 | HR 75 | Temp 98.2°F | Ht 77.0 in | Wt 324.8 lb

## 2021-11-07 DIAGNOSIS — R739 Hyperglycemia, unspecified: Secondary | ICD-10-CM

## 2021-11-07 DIAGNOSIS — K219 Gastro-esophageal reflux disease without esophagitis: Secondary | ICD-10-CM

## 2021-11-07 DIAGNOSIS — R079 Chest pain, unspecified: Secondary | ICD-10-CM

## 2021-11-07 DIAGNOSIS — E538 Deficiency of other specified B group vitamins: Secondary | ICD-10-CM

## 2021-11-07 MED ORDER — OMEPRAZOLE 40 MG PO CPDR: 40.0000 mg | DELAYED_RELEASE_CAPSULE | Freq: Every day | ORAL | 3 refills | Status: DC

## 2021-11-07 NOTE — Assessment & Plan Note (Signed)
On B12 daily. We can recheck at next office visit.

## 2021-11-07 NOTE — Progress Notes (Signed)
   Joseph [name] is a 36 y.o. male who presents today for an office visit.  Assessment/Plan:  New/Acute Problems: Palpitations / Chest Fluttering Unclear etiology based on history.  His EKG today is reassuring without any abnormalities.  It is possible he could be having PVCs or PACs based on his description.  It is also possible he could be having more reflux symptoms as he had similar symptoms a couple of years ago that resolved with treatment for GERD.  We discussed Holter monitoring however he deferred for now.  We will be treating his current as below.  He will let me know if not improving and we can get Holter monitor at that time.  We discussed reasons to return to care or seek emergent care.  Chronic Problems Addressed Today: Gastroesophageal reflux disease He has done well with omeprazole in the past.  This could be contributing to his fluttering sensations.  We will be starting a trial of Prilosec 40 mg daily for the next couple of weeks.  He will let me know if not improving.  B12 deficiency On B12 daily. We can recheck at next office visit.      Subjective:  HPI:  Patient here with fluttering sensation in his chest. This started a few weeks ago. We saw him about a month ago for his annual physical. There was concern for prostatitis and we started him on cipro. He was initially concerned that the fluttering sensation was due to a side effect. Lasts for a few moments and then goes away but can sometimes go on for up to a minutes. Symptoms happen randomly. Nothing while sleeping. No difficult breathing. No exertional symptoms.   He has noticed that he has had more gas production as well and has been belching more.  HE has had similar issues in the past which was diagnosed as GERD. This has recently been managed well.  Though he is not sure if he did anything recently to aggravate it.  He did have an URI a couple of weeks ago but this seems to have resolved as well.  His  prostatitis symptoms have resolved.       Objective:  Physical Exam: BP 115/79   Pulse 75   Temp 98.2 F (36.8 C) (Temporal)   Ht 6\' 5"  (1.956 m)   Wt (!) 324 lb 12.8 oz (147.3 kg)   SpO2 99%   BMI 38.52 kg/m   Gen: No acute distress, resting comfortably CV: Regular rate and rhythm with no murmurs appreciated Pulm: Normal work of breathing, clear to auscultation bilaterally with no crackles, wheezes, or rhonchi Neuro: Grossly normal, moves all extremities Psych: Normal affect and thought content  EKG: NSR. No ischemic changes. No ectopic beats or other arrhythmias.      . Katina Degree, MD 11/07/2021 1:59 PM

## 2021-11-07 NOTE — Assessment & Plan Note (Signed)
He has done well with omeprazole in the past.  This could be contributing to his fluttering sensations.  We will be starting a trial of Prilosec 40 mg daily for the next couple of weeks.  He will let me know if not improving.

## 2021-11-07 NOTE — Patient Instructions (Signed)
It was very nice to see you today!  Please start the omeprazole.  Let me know if not proving in the next 1 to 2 weeks and we can order a heart monitor.  Take care, Dr Jimmey Ralph  PLEASE NOTE:  If you had any lab tests please let us know if you have not heard back within a few days. You may see your results on mychart before we have a chance to review them but we will give you a call once they are reviewed by [place]. If we ordered any referrals today, please let us know if you have not heard from their office within the next week.   Please try these tips to maintain a healthy lifestyle:  Eat at least 3 REAL meals and 1-2 snacks per day.  Aim for no more than 5 hours between eating.  If you eat breakfast, please do so within one hour of getting up.   Each meal should contain half fruits/vegetables, one quarter protein, and one quarter carbs (no bigger than a computer mouse)  Cut down on sweet beverages. This includes juice, soda, and sweet tea.   Drink at least 1 glass of water with each meal and aim for at least 8 glasses per day  Exercise at least 150 minutes every week.

## 2021-11-25 ENCOUNTER — Other Ambulatory Visit: Payer: Self-pay | Admitting: Physician Assistant

## 2021-11-25 DIAGNOSIS — E538 Deficiency of other specified B group vitamins: Secondary | ICD-10-CM

## 2021-12-20 ENCOUNTER — Encounter: Payer: Self-pay | Admitting: Family Medicine

## 2021-12-20 ENCOUNTER — Ambulatory Visit: Payer: BC Managed Care – PPO | Admitting: Family Medicine

## 2021-12-20 VITALS — BP 132/70 | HR 95 | Temp 98.6°F | Ht 77.0 in | Wt 319.5 lb

## 2021-12-20 DIAGNOSIS — R059 Cough, unspecified: Secondary | ICD-10-CM

## 2021-12-20 DIAGNOSIS — J069 Acute upper respiratory infection, unspecified: Secondary | ICD-10-CM | POA: Diagnosis not present

## 2021-12-20 NOTE — Patient Instructions (Signed)
Consider over the counter Mucinex 1,200 mg twice daily.    Suspect viral process.

## 2021-12-20 NOTE — Progress Notes (Signed)
   Established Patient Office Visit  Subjective   Patient ID: Joseph [name], male    DOB: 1985-06-12  Age: 36 y.o. MRN: [id number]  Chief Complaint  Patient presents with   Cough    Patient complain of cough, x3 days, Productive cough with yellow sputum, Tried Mucinex and Tylenol   Nasal Congestion    Patient complains of nasal congestion, x3 days,    Hoarse   Chills    Patient complains of chills, x3 days    Generalized Body Aches    Patient complains of body aches, x3 days    HPI  {History (Optional):23778} Joseph [name] is seen with onset Tuesday of chills, nasal congestion, body aches, and now laryngitis and productive cough.  He is taken some over-the-counter Mucinex.  Wife had onset of similar symptoms about a week ago.  Still work this week.  Cough productive of yellow sputum.  He denies any chronic lung problems.  Non-smoker.  Past Medical History:  Diagnosis Date   Asthma    GERD (gastroesophageal reflux disease)    History reviewed. No pertinent surgical history.  reports that he has never smoked. He has been exposed to tobacco smoke. He has never used smokeless tobacco. He reports that he does not drink alcohol and does not use drugs. family history includes Diabetes in his father; Fibromyalgia in his mother; Hypertension in his father and mother. No Known Allergies  Review of Systems  Constitutional:  Negative for chills and fever.  HENT:  Positive for congestion. Negative for ear pain and sinus pain.   Respiratory:  Positive for cough. Negative for hemoptysis, shortness of breath and wheezing.       Objective:     BP 132/70 (BP Location: Left Arm, Patient Position: Sitting, Cuff Size: Large)   Pulse 95   Temp 98.6 F (37 C) (Oral)   Ht 6\' 5"  (1.956 m)   Wt (!) 319 lb 8 oz (144.9 kg)   SpO2 99%   BMI 37.89 kg/m  {Vitals History (Optional):23777}  Physical Exam Vitals reviewed.  Cardiovascular:     Rate and Rhythm: Normal rate and regular rhythm.  Pulmonary:      Effort: Pulmonary effort is normal.     Breath sounds: Normal breath sounds.  Neurological:     Mental Status: He is alert.      No results found for any visits on 12/20/21.  {Labs (Optional):23779}  The ASCVD Risk score (Arnett DK, et al., 2019) failed to calculate for the following reasons:   The 2019 ASCVD risk score is only valid for ages 74 to 43    Assessment & Plan:   Problem List Items Addressed This Visit   None Visit Diagnoses     Cough, unspecified type    -  Primary   Relevant Orders   POC COVID-19 BinaxNow     COVID screen negative.  Suspect acute viral URI with cough.  Nonfocal exam.  -Try Mucinex over-the-counter 1200 mg twice daily -Stay well-hydrated and plenty of rest -Discussed average duration of cough for acute viral bronchitis usually about 18 days. -Follow-up immediately for any fever, increased shortness of breath or other concerns.  No follow-ups on file.    Carolann Littler, MD

## 2021-12-21 LAB — POC COVID19 BINAXNOW: SARS Coronavirus 2 Ag: NEGATIVE

## 2022-02-02 ENCOUNTER — Other Ambulatory Visit: Payer: Self-pay | Admitting: Family Medicine

## 2022-04-01 ENCOUNTER — Ambulatory Visit: Payer: BC Managed Care – PPO | Admitting: Family Medicine

## 2022-04-01 ENCOUNTER — Ambulatory Visit
Admission: EM | Admit: 2022-04-01 | Discharge: 2022-04-01 | Disposition: A | Discharge status: Home / Self Care | Payer: BC Managed Care – PPO | Attending: Nurse Practitioner | Admitting: Nurse Practitioner

## 2022-04-01 ENCOUNTER — Ambulatory Visit: Admit: 2022-04-01 | Discharge status: Absent without leave | Payer: BC Managed Care – PPO

## 2022-04-01 DIAGNOSIS — J069 Acute upper respiratory infection, unspecified: Secondary | ICD-10-CM

## 2022-04-01 DIAGNOSIS — H1033 Unspecified acute conjunctivitis, bilateral: Secondary | ICD-10-CM | POA: Diagnosis not present

## 2022-04-01 DIAGNOSIS — Z1152 Encounter for screening for COVID-19: Secondary | ICD-10-CM | POA: Insufficient documentation

## 2022-04-01 DIAGNOSIS — R0981 Nasal congestion: Secondary | ICD-10-CM

## 2022-04-01 MED ORDER — POLYMYXIN B-TRIMETHOPRIM 10000-0.1 UNIT/ML-% OP SOLN: 1.0000 [drp] | OPHTHALMIC | 0 refills | Status: AC

## 2022-04-01 MED ORDER — BENZONATATE 200 MG PO CAPS: 200.0000 mg | ORAL_CAPSULE | Freq: Three times a day (TID) | ORAL | 0 refills | Status: DC | PRN

## 2022-04-01 NOTE — Discharge Instructions (Signed)
Antibiotic eyedrops as prescribed Tessalon as needed for cough The clinic will contact you if your COVID testing is positive Rest and fluids Follow-up with your PCP in 2 to 3 days for recheck Please go the emergency room if you have any worsening symptoms

## 2022-04-01 NOTE — ED Provider Notes (Signed)
Joseph [name]    CSN: [id number] Arrival date & time: 04/01/22  0920      History   Chief Complaint Chief Complaint  Patient presents with   Eye Drainage    HPI Joseph [name] is a 37 y.o. male who presents for evaluation of URI symptoms for 3 days. Patient reports associated symptoms of congestion, sore throat/throat congestion, bilateral eye drainage. Denies N/V/D, fevers, cough, body aches, ear pain, shortness of breath. Patient does  have a hx of asthma a child and grew out of it.  No smoking.  Children have similar symptoms and are being treated for pinkeye.   no recent travel. Pt is vaccinated for COVID. Pt has taken DayQuil OTC for symptoms. Pt has no other concerns at this time.   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 No pertinent surgical history.     Home Medications    Prior to Admission medications   Medication Sig Start Date End Date Taking? Authorizing Provider  benzonatate (TESSALON) 200 MG capsule Take 1 capsule (200 mg total) by mouth 3 (three) times daily as needed for cough. 04/01/22  Yes Melynda Ripple, NP  trimethoprim-polymyxin b (POLYTRIM) ophthalmic solution Place 1 drop into both eyes every 4 (four) hours for 7 days. 04/01/22 04/08/22 Yes Melynda Ripple, NP  cyanocobalamin (VITAMIN B12) 1000 MCG tablet TAKE 1 TABLET BY MOUTH EVERY DAY 11/25/21   Allwardt, Alyssa M, PA-C  omeprazole (PRILOSEC) 40 MG capsule TAKE 1 CAPSULE (40 MG TOTAL) BY MOUTH DAILY. 02/03/22   Vivi Barrack, MD  tiZANidine (ZANAFLEX) 4 MG tablet Take 1 tablet (4 mg total) by mouth every 8 (eight) hours as needed for muscle spasms. 09/24/21   Gregor Hams, MD     Family History Family History  Problem Relation Age of Onset   Hypertension Mother    Fibromyalgia Mother    Hypertension Father    Diabetes Father     Social History Social History   Tobacco Use   Smoking status: Never    Passive exposure: Yes   Smokeless tobacco: Never  Vaping Use   Vaping Use: Never used  Substance Use Topics   Alcohol use: Never   Drug use: Never     Allergies   Patient has no known allergies.   Review of Systems Review of Systems  HENT:  Positive for congestion and sore throat.   Eyes:  Positive for discharge and redness.     Physical Exam Triage Vital Signs ED Triage Vitals  Enc Vitals Group     BP 04/01/22 0945 138/80     Pulse Rate 04/01/22 0945 81     Resp 04/01/22 0945 18     Temp 04/01/22 0945 98.7 F (37.1 C)     Temp Source 04/01/22 0945 Oral     SpO2 04/01/22 0945 98 %     Weight --      Height --      Head Circumference --      Peak Flow --      Pain Score 04/01/22 0949 0     Pain Loc --  Pain Edu? --      Excl. in [place]? --    No data found.  Updated Vital Signs BP 138/80 (BP Location: Left Arm)   Pulse 81   Temp 98.7 F (37.1 C) (Oral)   Resp 18   SpO2 98%   Visual Acuity Right Eye Distance:   Left Eye Distance:   Bilateral Distance:    Right Eye Near:   Left Eye Near:    Bilateral Near:     Physical Exam Vitals and nursing note reviewed.  Constitutional:      General: He is not in acute distress.    Appearance: Normal appearance. He is not ill-appearing or toxic-appearing.  HENT:     Head: Normocephalic and atraumatic.     Right Ear: Tympanic membrane and ear canal normal.     Left Ear: Tympanic membrane and ear canal normal.     Nose: Congestion present.     Mouth/Throat:     Mouth: Mucous membranes are moist.     Pharynx: Posterior oropharyngeal erythema present.  Eyes:     General: Lids are normal.        Right eye: Discharge present. No foreign body or hordeolum.        Left eye:  Discharge present.No foreign body or hordeolum.     Conjunctiva/sclera:     Right eye: Right conjunctiva is injected. No chemosis, exudate or hemorrhage.    Left eye: Left conjunctiva is injected. No chemosis, exudate or hemorrhage.    Pupils: Pupils are equal, round, and reactive to light.     Comments: Dried crusty drainage to bilateral upper eyelids.  Cardiovascular:     Rate and Rhythm: Normal rate and regular rhythm.     Heart sounds: Normal heart sounds.  Pulmonary:     Effort: Pulmonary effort is normal.     Breath sounds: Normal breath sounds.  Musculoskeletal:     Cervical back: Normal range of motion and neck supple.  Lymphadenopathy:     Cervical: No cervical adenopathy.  Skin:    General: Skin is warm and dry.  Neurological:     General: No focal deficit present.     Mental Status: He is alert and oriented to person, place, and time.  Psychiatric:        Mood and Affect: Mood normal.        Behavior: Behavior normal.      UC Treatments / Results  Labs (all labs ordered are listed, but only abnormal results are displayed) Labs Reviewed  SARS CORONAVIRUS 2 (TAT 6-24 HRS)    EKG   Radiology No results found.  Procedures Procedures (including critical care time)  Medications Ordered in UC Medications - No data to display  Initial Impression / Assessment and Plan / UC Course  I have reviewed the triage vital signs and the nursing notes.  Pertinent labs & imaging results that were available during my care of the patient were reviewed by me and considered in my medical decision making (see chart for details).     Reviewed exam and symptoms with patient.  No red flags on exam.  COVID PCR and will contact if positive Tessalon as needed cough Discussed different types of conjunctivitis.  Given his children are being treated for bacterial conjunctivitis, start Polytrim.  Warm compresses to the eyes as needed Rest and fluids PCP follow-up 2 to 3 days for  recheck ER precautions reviewed and patient verbalized understanding Final Clinical Impressions(s) / UC Diagnoses  Final diagnoses:  Nasal congestion  Viral URI  Acute conjunctivitis of both eyes, unspecified acute conjunctivitis type     Discharge Instructions      Antibiotic eyedrops as prescribed Tessalon as needed for cough The clinic will contact you if your COVID testing is positive Rest and fluids Follow-up with your PCP in 2 to 3 days for recheck Please go the emergency room if you have any worsening symptoms   ED Prescriptions     Medication Sig Dispense Auth. Provider   benzonatate (TESSALON) 200 MG capsule Take 1 capsule (200 mg total) by mouth 3 (three) times daily as needed for cough. 20 capsule Melynda Ripple, NP   trimethoprim-polymyxin b (POLYTRIM) ophthalmic solution Place 1 drop into both eyes every 4 (four) hours for 7 days. 10 mL Melynda Ripple, NP      PDMP not reviewed this encounter.   Melynda Ripple, NP 04/01/22 1022

## 2022-04-01 NOTE — ED Triage Notes (Addendum)
Patient to Urgent Care with complaints of URI symptoms x3 days. Reports yesterday he started noticing bilateral eye drainage, this morning woke up with both eyes matted shut.  Fever Friday evening. Reports both of his children have been sick with colds and pink eye.

## 2022-04-02 LAB — SARS CORONAVIRUS 2 (TAT 6-24 HRS): SARS Coronavirus 2: NEGATIVE

## 2022-04-09 ENCOUNTER — Ambulatory Visit
Admission: RE | Admit: 2022-04-09 | Discharge: 2022-04-09 | Disposition: A | Discharge status: Home / Self Care | Payer: BC Managed Care – PPO | Source: Ambulatory Visit | Attending: Urgent Care | Admitting: Urgent Care

## 2022-04-09 VITALS — BP 120/84 | HR 81 | Temp 99.2°F | Ht 76.0 in | Wt 320.0 lb

## 2022-04-09 DIAGNOSIS — B9689 Other specified bacterial agents as the cause of diseases classified elsewhere: Secondary | ICD-10-CM

## 2022-04-09 DIAGNOSIS — J028 Acute pharyngitis due to other specified organisms: Secondary | ICD-10-CM | POA: Diagnosis not present

## 2022-04-09 LAB — POCT RAPID STREP A (OFFICE): Rapid Strep A Screen: NEGATIVE

## 2022-04-09 MED ORDER — AMOXICILLIN 500 MG PO CAPS: 500.0000 mg | ORAL_CAPSULE | Freq: Two times a day (BID) | ORAL | 0 refills | Status: AC

## 2022-04-09 NOTE — ED Provider Notes (Signed)
Joseph [name]    CSN: [id number] Arrival date & time: 04/09/22  1801      History   Chief Complaint Chief Complaint  Patient presents with   Sore Throat    Possible strep throat. Having pain on one side of the throat. some spots on tonsil. Pain while swallowing. - Entered by patient    HPI Joseph [name] is a 36 y.o. male.    Sore Throat    Patient presents to urgent care with concern for sore throat x 3 weeks.  He states he had influenza with accompanying symptoms of sore throat as well as conjunctivitis 2 weeks ago.  He states he noticed white spots on the back of his throat.  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 No pertinent surgical history.     Home Medications    Prior to Admission medications   Medication Sig Start Date End Date Taking? Authorizing Provider  benzonatate (TESSALON) 200 MG capsule Take 1 capsule (200 mg total) by mouth 3 (three) times daily as needed for cough. 04/01/22   Melynda Ripple, NP  cyanocobalamin (VITAMIN B12) 1000 MCG tablet TAKE 1 TABLET BY MOUTH EVERY DAY 11/25/21   Allwardt, Alyssa M, PA-C  omeprazole (PRILOSEC) 40 MG capsule TAKE 1 CAPSULE (40 MG TOTAL) BY MOUTH DAILY. 02/03/22   Vivi Barrack, MD  tiZANidine (ZANAFLEX) 4 MG tablet Take 1 tablet (4 mg total) by mouth every 8 (eight) hours as needed for muscle spasms. 09/24/21   Gregor Hams, MD    Family History Family History  Problem Relation Age of Onset   Hypertension Mother    Fibromyalgia Mother    Hypertension Father    Diabetes Father     Social History Social History   Tobacco Use   Smoking status: Never    Passive exposure: Yes    Smokeless tobacco: Never  Vaping Use   Vaping Use: Never used  Substance Use Topics   Alcohol use: Never   Drug use: Never     Allergies   Patient has no known allergies.   Review of Systems Review of Systems   Physical Exam Triage Vital Signs ED Triage Vitals  Enc Vitals Group     BP 04/09/22 1817 120/84     Pulse Rate 04/09/22 1817 81     Resp --      Temp 04/09/22 1817 99.2 F (37.3 C)     Temp Source 04/09/22 1817 Oral     SpO2 04/09/22 1817 97 %     Weight 04/09/22 1815 (!) 320 lb (145.2 kg)     Height 04/09/22 1815 6\' 4"  (1.93 m)     Head Circumference --      Peak Flow --      Pain Score 04/09/22 1815 4     Pain Loc --      Pain Edu? --      Excl. in [place]? --    No data found.  Updated Vital Signs BP 120/84 (BP Location: Left Arm)   Pulse 81   Temp 99.2 F (37.3 C) (Oral)   Ht 6\' 4"  (1.93 m)  Wt (!) 320 lb (145.2 kg)   SpO2 97%   BMI 38.95 kg/m   Visual Acuity Right Eye Distance:   Left Eye Distance:   Bilateral Distance:    Right Eye Near:   Left Eye Near:    Bilateral Near:     Physical Exam Vitals reviewed.  Constitutional:      Appearance: He is well-developed.  HENT:     Mouth/Throat:     Pharynx: Oropharyngeal exudate and posterior oropharyngeal erythema present.     Tonsils: Tonsillar exudate present. 2+ on the right. 2+ on the left.  Skin:    General: Skin is warm and dry.  Neurological:     General: No focal deficit present.     Mental Status: He is alert and oriented to person, place, and time.  Psychiatric:        Mood and Affect: Mood normal.        Behavior: Behavior normal.      UC Treatments / Results  Labs (all labs ordered are listed, but only abnormal results are displayed) Labs Reviewed  POCT RAPID STREP A (OFFICE)    EKG   Radiology No results found.  Procedures Procedures (including critical care time)  Medications Ordered in UC Medications - No data to display  Initial Impression / Assessment  and Plan / UC Course  I have reviewed the triage vital signs and the nursing notes.  Pertinent labs & imaging results that were available during my care of the patient were reviewed by me and considered in my medical decision making (see chart for details).   Patient is afebrile here without recent antipyretics. Satting well on room air. Overall is well appearing, well hydrated, without respiratory distress.  Pharynx is erythematous with presence of peritonsillar exudates, especially on the left side.  Rapid strep is negative but suspect poor sampling.  Will send for confirmatory culture while treating clinically for strep with amoxicillin.   Final Clinical Impressions(s) / UC Diagnoses   Final diagnoses:  None   Discharge Instructions   None    ED Prescriptions   None    PDMP not reviewed this encounter.   Rose Phi, [place] 04/09/22 1842

## 2022-04-09 NOTE — Discharge Instructions (Addendum)
Follow up here or with your primary care provider if your symptoms are worsening or not improving with treatment.     

## 2022-04-09 NOTE — ED Triage Notes (Signed)
Pt c/o continued sore throat x3weeks  Pt states that he had the flu, sore throat, and pink eye 2 weeks ago   Pt noticed white spots on the back of the throat.   Pt has been taking tylenol cold and flu severe for symptoms. Pt last dose was yesterday.

## 2022-04-11 ENCOUNTER — Ambulatory Visit: Payer: BC Managed Care – PPO | Admitting: Family Medicine

## 2022-04-12 LAB — CULTURE, GROUP A STREP (THRC)

## 2022-06-08 ENCOUNTER — Ambulatory Visit
Admission: RE | Admit: 2022-06-08 | Discharge: 2022-06-08 | Disposition: A | Discharge status: Home / Self Care | Payer: BC Managed Care – PPO | Source: Ambulatory Visit | Attending: Emergency Medicine | Admitting: Emergency Medicine

## 2022-06-08 VITALS — BP 110/74 | HR 100 | Temp 98.2°F | Resp 18

## 2022-06-08 DIAGNOSIS — J302 Other seasonal allergic rhinitis: Secondary | ICD-10-CM | POA: Diagnosis not present

## 2022-06-08 DIAGNOSIS — J029 Acute pharyngitis, unspecified: Secondary | ICD-10-CM | POA: Diagnosis not present

## 2022-06-08 LAB — POCT RAPID STREP A (OFFICE): Rapid Strep A Screen: NEGATIVE

## 2022-06-08 NOTE — ED Triage Notes (Signed)
Patient presents to [facility] for sore throat, nasal congestion, and sneezing since Thursday. Concerned with strep. Taking tylenol cold/sinus.   Denies fever.

## 2022-06-08 NOTE — ED Provider Notes (Signed)
Renaldo Fiddler    CSN: [id number] Arrival date & time: 06/08/22  0908      History   Chief Complaint Chief Complaint  Patient presents with   Sore Throat    Sore throat, possible strep - Entered by patient   Nasal Congestion    HPI Joseph [name] is a 37 y.o. male.  Patient presents with 3-day history of sore throat, congestion, sneezing.  He requests a strep test.  No fever, rash, shortness of breath, vomiting, diarrhea, or other symptoms.  Treatment attempted with OTC cold medication.  Patient was seen at this urgent care on 04/09/2022 diagnosed with acute bacterial pharyngitis and treated with amoxicillin.   The history is provided by the patient and medical records.    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 No pertinent surgical history.     Home Medications    Prior to Admission medications   Medication Sig Start Date End Date Taking? Authorizing Provider  benzonatate (TESSALON) 200 MG capsule Take 1 capsule (200 mg total) by mouth 3 (three) times daily as needed for cough. 04/01/22   Radford Pax, NP  cyanocobalamin (VITAMIN B12) 1000 MCG tablet TAKE 1 TABLET BY MOUTH EVERY DAY 11/25/21   Allwardt, Alyssa M, PA-C  omeprazole (PRILOSEC) 40 MG capsule TAKE 1 CAPSULE (40 MG TOTAL) BY MOUTH DAILY. 02/03/22   Ardith Dark, MD  tiZANidine (ZANAFLEX) 4 MG tablet Take 1 tablet (4 mg total) by mouth every 8 (eight) hours as needed for muscle spasms. 09/24/21   Rodolph Bong, MD    Family History Family History  Problem Relation Age of Onset   Hypertension Mother    Fibromyalgia Mother    Hypertension Father    Diabetes Father     Social  History Social History   Tobacco Use   Smoking status: Never    Passive exposure: Yes   Smokeless tobacco: Never  Vaping Use   Vaping Use: Never used  Substance Use Topics   Alcohol use: Never   Drug use: Never     Allergies   Patient has no known allergies.   Review of Systems Review of Systems  Constitutional:  Negative for chills and fever.  HENT:  Positive for congestion, postnasal drip, sneezing and sore throat.   Respiratory:  Negative for cough and shortness of breath.   Cardiovascular:  Negative for chest pain and palpitations.  Gastrointestinal:  Negative for diarrhea and vomiting.  Musculoskeletal:  Negative for arthralgias.  Skin:  Negative for rash.     Physical Exam Triage Vital Signs ED Triage Vitals  Enc Vitals Group     BP 06/08/22 0920 110/74     Pulse Rate 06/08/22 0915 100     Resp 06/08/22 0915 18     Temp 06/08/22 0915 98.2 F (36.8 C)     Temp src --      SpO2 06/08/22 0915 96 %     Weight --      Height --      Head Circumference --      Peak Flow --      Pain Score 06/08/22 0919 0  Pain Loc --      Pain Edu? --      Excl. in [place]? --    No data found.  Updated Vital Signs BP 110/74 (BP Location: Left Arm)   Pulse 100   Temp 98.2 F (36.8 C)   Resp 18   SpO2 96%   Visual Acuity Right Eye Distance:   Left Eye Distance:   Bilateral Distance:    Right Eye Near:   Left Eye Near:    Bilateral Near:     Physical Exam Vitals and nursing note reviewed.  Constitutional:      General: He is not in acute distress.    Appearance: Normal appearance. He is well-developed. He is not ill-appearing.  HENT:     Right Ear: Tympanic membrane normal.     Left Ear: Tympanic membrane normal.     Nose: Nose normal.     Mouth/Throat:     Mouth: Mucous membranes are moist.     Pharynx: Oropharynx is clear.     Comments: Clear PND.  Cardiovascular:     Rate and Rhythm: Normal rate and regular rhythm.     Heart sounds: Normal heart  sounds.  Pulmonary:     Effort: Pulmonary effort is normal. No respiratory distress.     Breath sounds: Normal breath sounds.  Musculoskeletal:     Cervical back: Neck supple.  Skin:    General: Skin is warm and dry.  Neurological:     Mental Status: He is alert.  Psychiatric:        Mood and Affect: Mood normal.        Behavior: Behavior normal.      UC Treatments / Results  Labs (all labs ordered are listed, but only abnormal results are displayed) Labs Reviewed  POCT RAPID STREP A (OFFICE)    EKG   Radiology No results found.  Procedures Procedures (including critical care time)  Medications Ordered in UC Medications - No data to display  Initial Impression / Assessment and Plan / UC Course  I have reviewed the triage vital signs and the nursing notes.  Pertinent labs & imaging results that were available during my care of the patient were reviewed by me and considered in my medical decision making (see chart for details).    Sore throat, seasonal allergies.  Rapid strep negative.  Discussed Tylenol as needed for discomfort.  Discussed Zyrtec for seasonal allergy symptoms.  Instructed patient to follow-up with his PCP.  Education provided on sore throat and allergic rhinitis.  Patient agrees to plan of care.  Final Clinical Impressions(s) / UC Diagnoses   Final diagnoses:  Sore throat  Seasonal allergies     Discharge Instructions      Your strep test is negative.    Take Tylenol as needed for discomfort.  Take Zyrtec for allergy symptoms.    Follow up with your primary care provider if your symptoms are not improving.        ED Prescriptions   None    PDMP not reviewed this encounter.   Mickie Bail, NP 06/08/22 [phone]

## 2022-06-08 NOTE — Discharge Instructions (Addendum)
Your strep test is negative.    Take Tylenol as needed for discomfort.  Take Zyrtec for allergy symptoms.    Follow up with your primary care provider if your symptoms are not improving.

## 2022-06-13 ENCOUNTER — Encounter: Payer: Self-pay | Admitting: Family Medicine

## 2022-06-13 ENCOUNTER — Ambulatory Visit (INDEPENDENT_AMBULATORY_CARE_PROVIDER_SITE_OTHER): Payer: BC Managed Care – PPO | Admitting: Family Medicine

## 2022-06-13 VITALS — BP 122/80 | HR 67 | Temp 97.8°F | Ht 76.0 in | Wt 301.0 lb

## 2022-06-13 DIAGNOSIS — L299 Pruritus, unspecified: Secondary | ICD-10-CM

## 2022-06-13 DIAGNOSIS — J029 Acute pharyngitis, unspecified: Secondary | ICD-10-CM

## 2022-06-13 DIAGNOSIS — E538 Deficiency of other specified B group vitamins: Secondary | ICD-10-CM | POA: Diagnosis not present

## 2022-06-13 LAB — POCT RAPID STREP A (OFFICE): Rapid Strep A Screen: POSITIVE — AB

## 2022-06-13 MED ORDER — AMOXICILLIN 500 MG PO CAPS: 500.0000 mg | ORAL_CAPSULE | Freq: Two times a day (BID) | ORAL | 0 refills | Status: AC

## 2022-06-13 NOTE — Patient Instructions (Addendum)
It was very nice to see you today!  You have strep throat.  Please start the amoxicillin.   Let me know if not improving in the next week or so.   Take care, Dr Jimmey Ralph  PLEASE NOTE:  If you had any lab tests, please let us know if you have not heard back within a few days. You may see your results on mychart before we have a chance to review them but we will give you a call once they are reviewed by [place].   If we ordered any referrals today, please let us know if you have not heard from their office within the next week.   If you had any urgent prescriptions sent in today, please check with the pharmacy within an hour of our visit to make sure the prescription was transmitted appropriately.   Please try these tips to maintain a healthy lifestyle:  Eat at least 3 REAL meals and 1-2 snacks per day.  Aim for no more than 5 hours between eating.  If you eat breakfast, please do so within one hour of getting up.   Each meal should contain half fruits/vegetables, one quarter protein, and one quarter carbs (no bigger than a computer mouse)  Cut down on sweet beverages. This includes juice, soda, and sweet tea.   Drink at least 1 glass of water with each meal and aim for at least 8 glasses per day  Exercise at least 150 minutes every week.

## 2022-06-13 NOTE — Assessment & Plan Note (Signed)
Stable on over-the-counter antihistamines.  No recent flares.

## 2022-06-13 NOTE — Assessment & Plan Note (Signed)
Doing well with all supplementation.  We can recheck labs when he comes back later this year for CPE.

## 2022-06-13 NOTE — Progress Notes (Signed)
   Rizwan Mammone is a 37 y.o. male who presents today for an office visit.  Assessment/Plan:  New/Acute Problems: Strep pharyngitis Rapid strep positive.  Start amoxicillin.  He can use over-the-counter meds as needed as well.  We discussed reasons to return to care.  Follow-up as needed.  Chronic Problems Addressed Today: Pruritus Stable on over-the-counter antihistamines.  No recent flares.  B12 deficiency Doing well with all supplementation.  We can recheck labs when he comes back later this year for CPE.     Subjective:  HPI:  See A/P for status of chronic conditions.  Patient is here today with sore throat. Symptoms started about a week ago. Went to urgent care 5 days ago. Was diagnosed with allergies. He is also having more congestion the last day or so. He has been having more hoarse voice recently.        Objective:  Physical Exam: BP 122/80   Pulse 67   Temp 97.8 F (36.6 C) (Temporal)   Ht 6\' 4"  (1.93 m)   Wt (!) 301 lb (136.5 kg)   SpO2 99%   BMI 36.64 kg/m   Gen: No acute distress, resting comfortably HEENT: OP erythematous.  TMs clear. CV: Regular rate and rhythm with no murmurs appreciated Pulm: Normal work of breathing, clear to auscultation bilaterally with no crackles, wheezes, or rhonchi Neuro: Grossly normal, moves all extremities Psych: Normal affect and thought content      Victorian Gunn M. Jimmey Ralph, MD 06/13/2022 12:45 PM

## 2022-07-01 ENCOUNTER — Ambulatory Visit: Payer: BC Managed Care – PPO | Admitting: Family Medicine

## 2022-07-01 ENCOUNTER — Encounter: Payer: Self-pay | Admitting: Family Medicine

## 2022-07-01 VITALS — BP 118/78 | HR 77 | Temp 97.3°F | Ht 76.0 in | Wt 298.4 lb

## 2022-07-01 DIAGNOSIS — J029 Acute pharyngitis, unspecified: Secondary | ICD-10-CM | POA: Diagnosis not present

## 2022-07-01 LAB — POCT RAPID STREP A (OFFICE): Rapid Strep A Screen: POSITIVE — AB

## 2022-07-01 MED ORDER — AZITHROMYCIN 250 MG PO TABS: ORAL_TABLET | ORAL | 0 refills | Status: DC

## 2022-07-01 NOTE — Patient Instructions (Addendum)
It was very nice to see you today!  Please start the azithromycin.  Make sure you are getting plenty of fluids.  Return if symptoms worsen or fail to improve.   Take care, Dr Jimmey Ralph  PLEASE NOTE:  If you had any lab tests, please let us know if you have not heard back within a few days. You may see your results on mychart before we have a chance to review them but we will give you a call once they are reviewed by [place].   If we ordered any referrals today, please let us know if you have not heard from their office within the next week.   If you had any urgent prescriptions sent in today, please check with the pharmacy within an hour of our visit to make sure the prescription was transmitted appropriately.   Please try these tips to maintain a healthy lifestyle:  Eat at least 3 REAL meals and 1-2 snacks per day.  Aim for no more than 5 hours between eating.  If you eat breakfast, please do so within one hour of getting up.   Each meal should contain half fruits/vegetables, one quarter protein, and one quarter carbs (no bigger than a computer mouse)  Cut down on sweet beverages. This includes juice, soda, and sweet tea.   Drink at least 1 glass of water with each meal and aim for at least 8 glasses per day  Exercise at least 150 minutes every week.

## 2022-07-01 NOTE — Progress Notes (Signed)
   Joseph [name] is a 37 y.o. male who presents today for an office visit.  Assessment/Plan:  Strep Pharyngitis / Sore Throat Rapid strep positive.  Discussed with patient this may be a false positive however given then that his symptoms have recurred and are worsening over the last several days would be reasonable to start an additional round of antibiotics.  Will start azithromycin.  Encouraged hydration.  He can use Tylenol and ibuprofen as needed.  We discussed reasons to return to care.  Follow-up as needed.     Subjective:  HPI:  See A/P for status of chronic conditions.  Patient is here for follow-up.  We saw him a couple of weeks ago for sore throat.  Rapid strep at that time was positive.  We started him on amoxicillin.  He did well with this however since stopping his antibiotic he has a recurrence of sore throat and he has noticed some white spots in his throat. No fevers or chills. Tylenol and ibuprofen has helped modestly. Wife has been sick with similar symptoms. Chldren also recently got diagnosed with ear infections.        Objective:  Physical Exam: BP 118/78   Pulse 77   Temp (!) 97.3 F (36.3 C) (Temporal)   Ht 6\' 4"  (1.93 m)   Wt 298 lb 6.4 oz (135.4 kg)   SpO2 99%   BMI 36.32 kg/m   Gen: No acute distress, resting comfortably HEENT: TMs clear.  OP erythematous.  No exudates. CV: Regular rate and rhythm with no murmurs appreciated Pulm: Normal work of breathing, clear to auscultation bilaterally with no crackles, wheezes, or rhonchi Neuro: Grossly normal, moves all extremities Psych: Normal affect and thought content      Joseph [name] M. Jimmey Ralph, MD 07/01/2022 9:11 AM

## 2022-07-17 ENCOUNTER — Ambulatory Visit: Payer: BC Managed Care – PPO | Admitting: Physician Assistant

## 2022-07-24 ENCOUNTER — Ambulatory Visit: Payer: BC Managed Care – PPO | Admitting: Family Medicine

## 2022-07-24 ENCOUNTER — Encounter: Payer: Self-pay | Admitting: Family Medicine

## 2022-07-24 VITALS — BP 138/88 | HR 78 | Temp 97.3°F | Ht 76.0 in | Wt 301.6 lb

## 2022-07-24 DIAGNOSIS — K219 Gastro-esophageal reflux disease without esophagitis: Secondary | ICD-10-CM

## 2022-07-24 DIAGNOSIS — G4730 Sleep apnea, unspecified: Secondary | ICD-10-CM | POA: Diagnosis not present

## 2022-07-24 DIAGNOSIS — L299 Pruritus, unspecified: Secondary | ICD-10-CM | POA: Diagnosis not present

## 2022-07-24 NOTE — Assessment & Plan Note (Signed)
Doing much better now.  On Prilosec 40 mg daily as needed.

## 2022-07-24 NOTE — Patient Instructions (Addendum)
It was very nice to see you today!  Please take a double dose of the zyrtec.  Let me know how you are doing next week.  I will refer you to dermatology and for a sleep study.  No follow-ups on file.   Take care, Dr Jimmey Ralph  PLEASE NOTE:  If you had any lab tests, please let us know if you have not heard back within a few days. You may see your results on mychart before we have a chance to review them but we will give you a call once they are reviewed by [place].   If we ordered any referrals today, please let us know if you have not heard from their office within the next week.   If you had any urgent prescriptions sent in today, please check with the pharmacy within an hour of our visit to make sure the prescription was transmitted appropriately.   Please try these tips to maintain a healthy lifestyle:  Eat at least 3 REAL meals and 1-2 snacks per day.  Aim for no more than 5 hours between eating.  If you eat breakfast, please do so within one hour of getting up.   Each meal should contain half fruits/vegetables, one quarter protein, and one quarter carbs (no bigger than a computer mouse)  Cut down on sweet beverages. This includes juice, soda, and sweet tea.   Drink at least 1 glass of water with each meal and aim for at least 8 glasses per day  Exercise at least 150 minutes every week.

## 2022-07-24 NOTE — Progress Notes (Signed)
   Joseph [name] is a 37 y.o. male who presents today for an office visit.  Assessment/Plan:  New/Acute Problems: Rash  No red flags.  Broad differential including eczema, chronic urticaria, viral exanthem, and allergic dermatitis.  He has had ongoing issues with pruritus and rash for the last year or so it has gotten much since his recent viral illness.  Did not have much improved with the recent prednisone burst.  He will continue using topical daily emollients designed for sensitive skin or eczema.  We will also ask him to increase his dose of Zyrtec to 20 mg daily.  He will let me know how he is doing by next week and we can consider addition of Singulair at that time as well.  Will be placing referral to dermatology for further management evaluation though we will pursue above management first as it would likely take several weeks to months before he can see a dermatologist.  Chronic Problems Addressed Today: Pruritus Was previously doing well on over-the-counter histamines however has had a flareup recently due to recent viral illness.  May be eczema though may also have chronic urticaria as above.  Will increase his dose of Zyrtec to 20 mg daily and consider adding on Singulair if not improving.  Will refer to dermatology as well as above.  Gastroesophageal reflux disease Doing much better now.  On Prilosec 40 mg daily as needed.  Sleep-disordered breathing Patient's wife notes that he snores frequently and she is concerned about potential sleep apnea.  No witnessed apneic episodes.  Has quite a bit of daytime somnolence as well.  Will place referral for sleep study.     Subjective:  HPI:  See A/P for status of chronic conditions.  Patient is here with rash.  Started about a week and a half ago.  Located on torso mostly but also arms and legs.  Tried using over-the-counter meds however symptoms did not improve and he went to urgent care.  He was given prednisone taper.  He does not  think that this has helped much. Still has occasionally itchniess. He is still having some pustules pop up. Tried using eczema lotion.        Objective:  Physical Exam: BP 138/88   Pulse 78   Temp (!) 97.3 F (36.3 C) (Temporal)   Ht 6\' 4"  (1.93 m)   Wt (!) 301 lb 9.6 oz (136.8 kg)   SpO2 98%   BMI 36.71 kg/m   Gen: No acute distress, resting comfortably Skin: Several small discrete erythematous papules scattered across torso and extremities.  Neuro: Grossly normal, moves all extremities Psych: Normal affect and thought content      Koralynn Greenspan M. Jimmey Ralph, MD 07/24/2022 9:24 AM

## 2022-07-24 NOTE — Assessment & Plan Note (Signed)
Patient's wife notes that he snores frequently and she is concerned about potential sleep apnea.  No witnessed apneic episodes.  Has quite a bit of daytime somnolence as well.  Will place referral for sleep study.

## 2022-07-24 NOTE — Assessment & Plan Note (Signed)
Was previously doing well on over-the-counter histamines however has had a flareup recently due to recent viral illness.  May be eczema though may also have chronic urticaria as above.  Will increase his dose of Zyrtec to 20 mg daily and consider adding on Singulair if not improving.  Will refer to dermatology as well as above.

## 2022-08-04 ENCOUNTER — Ambulatory Visit: Payer: BC Managed Care – PPO | Admitting: Family Medicine

## 2022-08-04 ENCOUNTER — Encounter: Payer: Self-pay | Admitting: Family Medicine

## 2022-08-04 VITALS — BP 121/79 | HR 75 | Temp 97.8°F | Ht 76.0 in | Wt 305.0 lb

## 2022-08-04 DIAGNOSIS — J029 Acute pharyngitis, unspecified: Secondary | ICD-10-CM

## 2022-08-04 DIAGNOSIS — L219 Seborrheic dermatitis, unspecified: Secondary | ICD-10-CM | POA: Diagnosis not present

## 2022-08-04 LAB — POCT RAPID STREP A (OFFICE): Rapid Strep A Screen: POSITIVE — AB

## 2022-08-04 MED ORDER — KETOCONAZOLE 2 % EX CREA
1.0000 | TOPICAL_CREAM | Freq: Two times a day (BID) | CUTANEOUS | 0 refills | Status: AC
Start: 2022-08-04 — End: ?

## 2022-08-04 MED ORDER — AZITHROMYCIN 250 MG PO TABS: ORAL_TABLET | ORAL | 0 refills | Status: DC

## 2022-08-04 NOTE — Patient Instructions (Signed)
It was very nice to see you today!  You have strep throat.  Please start the azithromycin.  He can use Tylenol and ibuprofen as needed.  The rash on your face is seborrheic dermatitis.  Please use the ketoconazole cream.  Can also use hydrocortisone cream.  Let me know if not improving.  Return if symptoms worsen or fail to improve.   Take care, Dr Jimmey Ralph  PLEASE NOTE:  If you had any lab tests, please let us know if you have not heard back within a few days. You may see your results on mychart before we have a chance to review them but we will give you a call once they are reviewed by [place].   If we ordered any referrals today, please let us know if you have not heard from their office within the next week.   If you had any urgent prescriptions sent in today, please check with the pharmacy within an hour of our visit to make sure the prescription was transmitted appropriately.   Please try these tips to maintain a healthy lifestyle:  Eat at least 3 REAL meals and 1-2 snacks per day.  Aim for no more than 5 hours between eating.  If you eat breakfast, please do so within one hour of getting up.   Each meal should contain half fruits/vegetables, one quarter protein, and one quarter carbs (no bigger than a computer mouse)  Cut down on sweet beverages. This includes juice, soda, and sweet tea.   Drink at least 1 glass of water with each meal and aim for at least 8 glasses per day  Exercise at least 150 minutes every week.

## 2022-08-04 NOTE — Progress Notes (Signed)
   Joseph [name] is a 37 y.o. male who presents today for an office visit.  Assessment/Plan:  New/Acute Problems: Sore throat Rapid strep positive.  Did discuss that this may be a result of his strep infection a month ago however due to recurrence of symptoms would be reasonable to start another course of antibiotics.  We will start azithromycin.  He has done well with this in the past.  Encouraged hydration.  Can use over-the-counter meds as needed.  We discussed reasons to return to care.  Follow-up as needed.  Chronic Problems Addressed Today: Seborrheic dermatitis No red flags.  Start topical ketoconazole.  Can also use hydrocortisone cream as needed.  He will let us know if not improving.     Subjective:  HPI:  Patient here with sore throat. We saw him a little over a month ago for sore throat and his strep test was positive at that time.  Did well for a while however had recurrence over the last month or so.  He has 2 young children have been sick.  Wife is also sick with similar symptoms.  Ibuprofen helps modestly.  Some cough and congestion.  No fevers or chills.  Also has flaking rash on face.  This is been present for at least 20 years or so.  No specific treatments tried.       Objective:  Physical Exam: BP 121/79   Pulse 75   Temp 97.8 F (36.6 C) (Temporal)   Ht 6\' 4"  (1.93 m)   Wt (!) 305 lb (138.3 kg)   SpO2 99%   BMI 37.13 kg/m   Gen: No acute distress, resting comfortably HEENT: Tonsillar hypertrophy noted with exudates. CV: Regular rate and rhythm with no murmurs appreciated Pulm: Normal work of breathing, clear to auscultation bilaterally with no crackles, wheezes, or rhonchi Skin: Seborrheic dermatitis scattered across eyebrows Neuro: Grossly normal, moves all extremities Psych: Normal affect and thought content      Joseph [name] M. Jimmey Ralph, MD 08/04/2022 8:54 AM

## 2022-08-04 NOTE — Assessment & Plan Note (Signed)
No red flags.  Start topical ketoconazole.  Can also use hydrocortisone cream as needed.  He will let us know if not improving.

## 2022-08-21 ENCOUNTER — Ambulatory Visit: Payer: BC Managed Care – PPO | Admitting: Family Medicine

## 2022-08-21 ENCOUNTER — Encounter: Payer: Self-pay | Admitting: Family Medicine

## 2022-08-21 VITALS — BP 108/73 | HR 69 | Temp 97.3°F | Ht 76.0 in | Wt 299.4 lb

## 2022-08-21 DIAGNOSIS — L219 Seborrheic dermatitis, unspecified: Secondary | ICD-10-CM | POA: Diagnosis not present

## 2022-08-21 DIAGNOSIS — K219 Gastro-esophageal reflux disease without esophagitis: Secondary | ICD-10-CM | POA: Diagnosis not present

## 2022-08-21 DIAGNOSIS — J029 Acute pharyngitis, unspecified: Secondary | ICD-10-CM

## 2022-08-21 LAB — POCT RAPID STREP A (OFFICE): Rapid Strep A Screen: POSITIVE — AB

## 2022-08-21 MED ORDER — KETOCONAZOLE 2 % EX SHAM: 1.0000 | MEDICATED_SHAMPOO | CUTANEOUS | 0 refills | Status: DC

## 2022-08-21 NOTE — Assessment & Plan Note (Addendum)
Stable on Prilosec 40 mg daily.  Doubt that this is contributing to recurrent sore throat however if does have continued issues with sore throat would consider switching to Protonix for twice daily dosing to see if this helps.

## 2022-08-21 NOTE — Patient Instructions (Addendum)
It was very nice to see you today!  Your strep today was positive however I think this is a false positive.  Please let us know if you have any returning symptoms.  If this continues to be a recurrent issue will need to refer you to see the ear nose and throat doctor.  I am glad that the seborrheic dermatitis is improving.  You can continue using the ketoconazole cream.  I will also send in a shampoo for you.  Return if symptoms worsen or fail to improve.   Take care, Dr Jimmey Ralph  PLEASE NOTE:  If you had any lab tests, please let us know if you have not heard back within a few days. You may see your results on mychart before we have a chance to review them but we will give you a call once they are reviewed by [place].   If we ordered any referrals today, please let us know if you have not heard from their office within the next week.   If you had any urgent prescriptions sent in today, please check with the pharmacy within an hour of our visit to make sure the prescription was transmitted appropriately.   Please try these tips to maintain a healthy lifestyle:  Eat at least 3 REAL meals and 1-2 snacks per day.  Aim for no more than 5 hours between eating.  If you eat breakfast, please do so within one hour of getting up.   Each meal should contain half fruits/vegetables, one quarter protein, and one quarter carbs (no bigger than a computer mouse)  Cut down on sweet beverages. This includes juice, soda, and sweet tea.   Drink at least 1 glass of water with each meal and aim for at least 8 glasses per day  Exercise at least 150 minutes every week.

## 2022-08-21 NOTE — Assessment & Plan Note (Signed)
He is doing better with ketoconazole for his facial rash.  He is having some symptoms on the scalp as well.  The cream has worked for this however he does have some difficulty with getting this applied.  Will start ketoconazole shampoo as well.  He will let us know if not improving.

## 2022-08-21 NOTE — Progress Notes (Signed)
   Joseph [name] is a 37 y.o. male who presents today for an office visit.  Assessment/Plan:  New/Acute Problems: Sore Throat Rapid strep was positive however symptoms have resolved.  His rapid positive is likely false positive due to recent infection.  May have had a brief mild viral URI.  We will hold off on antibiotics at this point.  He has had numerous episodes of sore throat over the last several months.  We did discuss referral to ENT however we will hold off on this for now.  If this continues to be a recurrent issue we will place referral.  Discussed reasons return to care and seek emergent care.  Chronic Problems Addressed Today: Seborrheic dermatitis He is doing better with ketoconazole for his facial rash.  He is having some symptoms on the scalp as well.  The cream has worked for this however he does have some difficulty with getting this applied.  Will start ketoconazole shampoo as well.  He will let us know if not improving.  Gastroesophageal reflux disease Stable on Prilosec 40 mg daily.  Doubt that this is contributing to recurrent sore throat however if does have continued issues with sore throat would consider switching to Protonix for twice daily dosing to see if this helps.     Subjective:  HPI:  See Assessment / plan for status of chronic conditions.    He was last here 17 days ago. Rapid strep at that point was positive. He was started on azithromycin and symptoms improved shortly after. Over the last few days he did have recurrence.  Felt similar to past episode of strep throat.  Symptoms resolved after couple days.  He has not had any recurrence for the last couple days.  Chart have been sick with runny noses.  He has not had any fevers or chills.  No rhinorrhea.  Little bit more sneezing.        Objective:  Physical Exam: BP 108/73   Pulse 69   Temp (!) 97.3 F (36.3 C) (Temporal)   Ht 6\' 4"  (1.93 m)   Wt 299 lb 6.4 oz (135.8 kg)   SpO2 100%   BMI 36.44  kg/m   Gen: No acute distress, resting comfortably HEENT: OP erythematous.  No exudate. CV: Regular rate and rhythm with no murmurs appreciated Pulm: Normal work of breathing, clear to auscultation bilaterally with no crackles, wheezes, or rhonchi Neuro: Grossly normal, moves all extremities Psych: Normal affect and thought content      Fronie Holstein M. Jimmey Ralph, MD 08/21/2022 11:48 AM

## 2022-09-09 ENCOUNTER — Ambulatory Visit: Payer: BC Managed Care – PPO | Admitting: Family Medicine

## 2022-09-09 ENCOUNTER — Telehealth: Payer: Self-pay | Admitting: Family Medicine

## 2022-09-09 VITALS — BP 107/77 | HR 74 | Temp 97.5°F | Ht 76.0 in | Wt 297.0 lb

## 2022-09-09 DIAGNOSIS — K219 Gastro-esophageal reflux disease without esophagitis: Secondary | ICD-10-CM

## 2022-09-09 DIAGNOSIS — J029 Acute pharyngitis, unspecified: Secondary | ICD-10-CM

## 2022-09-09 DIAGNOSIS — L219 Seborrheic dermatitis, unspecified: Secondary | ICD-10-CM | POA: Diagnosis not present

## 2022-09-09 LAB — POCT RAPID STREP A (OFFICE): Rapid Strep A Screen: POSITIVE — AB

## 2022-09-09 MED ORDER — AZITHROMYCIN 250 MG PO TABS: ORAL_TABLET | ORAL | 0 refills | Status: DC

## 2022-09-09 NOTE — Telephone Encounter (Signed)
Patient states saw pcp today and was informed azithromycin would be sent in. States pharmacy informed him that medication order is not there. Can this be sent in please?

## 2022-09-09 NOTE — Addendum Note (Signed)
Addended by: Ardith Dark on: 09/09/2022 03:21 PM   Modules accepted: Orders

## 2022-09-09 NOTE — Progress Notes (Signed)
   Roczen Waymire is a 37 y.o. male who presents today for an office visit.  Assessment/Plan:  New/Acute Problems: Strep Pharyngitis  Patient rapid strep positive and has significant tonsillar erythema and exudate on exam.  We will treat with azithromycin.  He has previously done well with this.  This is his fourth episode of strep pharyngitis the last 3 months.  Given recurrence and severity of symptoms would be reasonable for him to see ENT at this point.  Will place referral.  He can continue over-the-counter meds.  We discussed reasons to return to care.  Follow-up as needed.  Chronic Problems Addressed Today: Seborrheic dermatitis Doing much better with ketoconazole cream and shampoo.  He can use as needed for flareups.  Does not need refill today.  Gastroesophageal reflux disease Stable on Prilosec 40 mg daily.    Subjective:  HPI:  See Assessment / plan for status of chronic conditions.   Patient here today with sore throat. This started a few days ago. Also started noticing white spots yesterday. Tried taking ibuprofen which has helped. Wife has been sick with similar symptoms. Children have been doing well.        Objective:  Physical Exam: BP 107/77   Pulse 74   Temp (!) 97.5 F (36.4 C) (Temporal)   Ht 6\' 4"  (1.93 m)   Wt 297 lb (134.7 kg)   SpO2 98%   BMI 36.15 kg/m   Gen: No acute distress, resting comfortably HEENT: Bilateral tonsillar erythema with white exudate. CV: Regular rate and rhythm with no murmurs appreciated Pulm: Normal work of breathing, clear to auscultation bilaterally with no crackles, wheezes, or rhonchi Neuro: Grossly normal, moves all extremities Psych: Normal affect and thought content      Gahel Safley M. Jimmey Ralph, MD 09/09/2022 11:58 AM

## 2022-09-09 NOTE — Assessment & Plan Note (Signed)
Stable on Prilosec 40 mg daily. 

## 2022-09-09 NOTE — Patient Instructions (Signed)
It was very nice to see you today!  You have strep throat.   Please start the azithromycin.  Will refer you to ENT as well.  Return if symptoms worsen or fail to improve.   Take care, Dr Jimmey Ralph  PLEASE NOTE:  If you had any lab tests, please let us know if you have not heard back within a few days. You may see your results on mychart before we have a chance to review them but we will give you a call once they are reviewed by [place].   If we ordered any referrals today, please let us know if you have not heard from their office within the next week.   If you had any urgent prescriptions sent in today, please check with the pharmacy within an hour of our visit to make sure the prescription was transmitted appropriately.   Please try these tips to maintain a healthy lifestyle:  Eat at least 3 REAL meals and 1-2 snacks per day.  Aim for no more than 5 hours between eating.  If you eat breakfast, please do so within one hour of getting up.   Each meal should contain half fruits/vegetables, one quarter protein, and one quarter carbs (no bigger than a computer mouse)  Cut down on sweet beverages. This includes juice, soda, and sweet tea.   Drink at least 1 glass of water with each meal and aim for at least 8 glasses per day  Exercise at least 150 minutes every week.

## 2022-09-09 NOTE — Assessment & Plan Note (Signed)
Doing much better with ketoconazole cream and shampoo.  He can use as needed for flareups.  Does not need refill today.

## 2022-09-11 NOTE — Telephone Encounter (Signed)
Rx was sent in 09/09/2022.

## 2022-09-22 ENCOUNTER — Other Ambulatory Visit: Payer: Self-pay | Admitting: Family Medicine

## 2022-09-23 ENCOUNTER — Ambulatory Visit: Payer: BC Managed Care – PPO | Admitting: Family Medicine

## 2022-09-23 ENCOUNTER — Encounter: Payer: Self-pay | Admitting: Family Medicine

## 2022-09-23 VITALS — BP 113/76 | HR 85 | Temp 98.2°F | Ht 76.0 in | Wt 299.6 lb

## 2022-09-23 DIAGNOSIS — J029 Acute pharyngitis, unspecified: Secondary | ICD-10-CM

## 2022-09-23 LAB — POCT RAPID STREP A (OFFICE): Rapid Strep A Screen: NEGATIVE

## 2022-09-23 NOTE — Patient Instructions (Signed)
It was very nice to see you today!  Your strep test in office is negative.  Will send out a culture.  You can use over-the-counter meds as needed.  Will call you once your culture result comes back.  Return if symptoms worsen or fail to improve.   Take care, Dr Jimmey Ralph  PLEASE NOTE:  If you had any lab tests, please let us know if you have not heard back within a few days. You may see your results on mychart before we have a chance to review them but we will give you a call once they are reviewed by [place].   If we ordered any referrals today, please let us know if you have not heard from their office within the next week.   If you had any urgent prescriptions sent in today, please check with the pharmacy within an hour of our visit to make sure the prescription was transmitted appropriately.   Please try these tips to maintain a healthy lifestyle:  Eat at least 3 REAL meals and 1-2 snacks per day.  Aim for no more than 5 hours between eating.  If you eat breakfast, please do so within one hour of getting up.   Each meal should contain half fruits/vegetables, one quarter protein, and one quarter carbs (no bigger than a computer mouse)  Cut down on sweet beverages. This includes juice, soda, and sweet tea.   Drink at least 1 glass of water with each meal and aim for at least 8 glasses per day  Exercise at least 150 minutes every week.

## 2022-09-23 NOTE — Progress Notes (Signed)
   Joseph [name] is a 37 y.o. male who presents today for an office visit.  Assessment/Plan:  Sore Throat Rapid strep negative.  We will send out culture.  This has been a recurrent issue for the last couple of months and he does have upcoming appointment with ENT next week for further evaluation.  Current symptoms likely due to a mild viral upper respiratory infection (URI).  It is reassuring that symptoms are improving.  He can use over-the-counter meds for the next few days until culture result returns and he will follow-up with ENT next week.  Encouraged hydration.  We discussed reasons return to care.  Follow-up as needed.     Subjective:  HPI:  Patient here with sore throat. This started a few days ago. He has been having issues with recurrent sore throat for the last couple of months.  Recently he was here couple of weeks ago.  Rapid strep was positive.  Started on azithromycin.  He was referred to ENT and has an upcoming appoint with him next week.  He was initially doing well however over the last 2 days he has had recurrence of sore throat.  Primarily located on the right side.  Also having some more neck pain.  Some mild headache.  Also has had some more nasal drainage mixed with blood.  No fevers.  Tried ibuprofen which does help.  Children have been sick with URI as well.  Symptoms have not improved in the last few days.        Objective:  Physical Exam: BP 113/76   Pulse 85   Temp 98.2 F (36.8 C) (Temporal)   Ht 6\' 4"  (1.93 m)   Wt 299 lb 9.6 oz (135.9 kg)   SpO2 99%   BMI 36.47 kg/m   Gen: No acute distress, resting comfortably HEENT: Tonsils with mild hypertrophy bilaterally.  No exudates.  No lymphadenopathy.  TMs with clear effusion bilaterally. CV: Regular rate and rhythm with no murmurs appreciated Pulm: Normal work of breathing, clear to auscultation bilaterally with no crackles, wheezes, or rhonchi Neuro: Grossly normal, moves all extremities Psych: Normal  affect and thought content      Bevan Disney M. Jimmey Ralph, MD 09/23/2022 2:58 PM

## 2022-09-25 LAB — CULTURE, GROUP A STREP
MICRO NUMBER:: 15235380
SPECIMEN QUALITY:: ADEQUATE

## 2022-09-29 NOTE — Progress Notes (Signed)
Strep culture is negative.  He does not need antibiotics.  He should let us know if symptoms are not improving.

## 2022-10-02 ENCOUNTER — Ambulatory Visit (INDEPENDENT_AMBULATORY_CARE_PROVIDER_SITE_OTHER): Payer: BC Managed Care – PPO | Admitting: Otolaryngology

## 2022-10-02 ENCOUNTER — Encounter (INDEPENDENT_AMBULATORY_CARE_PROVIDER_SITE_OTHER): Payer: Self-pay | Admitting: Otolaryngology

## 2022-10-02 VITALS — BP 129/88 | HR 60 | Ht 76.4 in | Wt 299.0 lb

## 2022-10-02 DIAGNOSIS — J029 Acute pharyngitis, unspecified: Secondary | ICD-10-CM | POA: Diagnosis not present

## 2022-10-02 DIAGNOSIS — K219 Gastro-esophageal reflux disease without esophagitis: Secondary | ICD-10-CM

## 2022-10-02 DIAGNOSIS — J3501 Chronic tonsillitis: Secondary | ICD-10-CM | POA: Diagnosis not present

## 2022-10-02 DIAGNOSIS — J351 Hypertrophy of tonsils: Secondary | ICD-10-CM

## 2022-10-02 DIAGNOSIS — R0981 Nasal congestion: Secondary | ICD-10-CM | POA: Diagnosis not present

## 2022-10-02 DIAGNOSIS — R0683 Snoring: Secondary | ICD-10-CM

## 2022-10-02 DIAGNOSIS — J3089 Other allergic rhinitis: Secondary | ICD-10-CM

## 2022-10-02 DIAGNOSIS — F515 Nightmare disorder: Secondary | ICD-10-CM

## 2022-10-02 MED ORDER — DESLORATADINE 5 MG PO TABS: 5.0000 mg | ORAL_TABLET | Freq: Every day | ORAL | 3 refills | Status: AC

## 2022-10-02 MED ORDER — FAMOTIDINE 20 MG PO TABS
20.0000 mg | ORAL_TABLET | Freq: Two times a day (BID) | ORAL | 1 refills | Status: DC
Start: 2022-10-02 — End: 2022-11-20

## 2022-10-02 MED ORDER — FLUTICASONE PROPIONATE 50 MCG/ACT NA SUSP: 2.0000 | Freq: Every day | NASAL | 6 refills | Status: AC

## 2022-10-02 NOTE — Progress Notes (Signed)
ENT CONSULT:  Reason for Consult: recurrent strep throat    HPI: Joseph [name] is an 37 y.o. male here for recurrent strep throat  (positive strep test 2 x April, 2 x June and 1 x July). He gets nasal congestion and sore throat. He has twin toddlers in daycare. He has GERD. Last time he had strep was when he was 37 yo, until recently. Had multiple rounds of abx, Amox, then Z-pack and then another round of Zpack, total of 3 courses of abx. Last Zpack mid June.  Today no sx. He is being referred to Sleep medicine for workup of suspected OSA. Has sx of nightmares, morning headaches and headaches at night. Hx of B12 deficiency. No dysphagia, no hemoptysis, no voice changes. No dyspnea.     Past Medical History:  Diagnosis Date   Asthma    GERD (gastroesophageal reflux disease)     History reviewed. No pertinent surgical history.  Family History  Problem Relation Age of Onset   Hypertension Mother    Fibromyalgia Mother    Hypertension Father    Diabetes Father     Social History:  reports that he has never smoked. He has been exposed to tobacco smoke. He has never used smokeless tobacco. He reports that he does not drink alcohol and does not use drugs.  Allergies: No Known Allergies  Medications: I have reviewed the patient's current medications.  The PMH, PSH, Medications, Allergies, and SH were reviewed and updated.  ROS: Constitutional: Negative for fever, weight loss and weight gain. Cardiovascular: Negative for chest pain and dyspnea on exertion. Respiratory: Is not experiencing shortness of breath at rest. Gastrointestinal: Negative for nausea and vomiting. Neurological: Negative for headaches. Psychiatric: The patient is not nervous/anxious  Blood pressure 129/88, pulse 60, height 6' 4.4" (1.941 m), weight 299 lb (135.6 kg), SpO2 97%.  PHYSICAL EXAM:  Exam: General: Well-developed, well-nourished Communication and Voice: Clear pitch and  clarity Respiratory Respiratory effort: Equal inspiration and expiration without stridor Cardiovascular Peripheral Vascular: Warm extremities with equal color/perfusion Eyes: No nystagmus with equal extraocular motion bilaterally Neuro/Psych/Balance: Patient oriented to person, place, and time; Appropriate mood and affect; Gait is intact with no imbalance; Cranial nerves I-XII are intact Head and Face Inspection: Normocephalic and atraumatic without mass or lesion Palpation: Facial skeleton intact without bony stepoffs Salivary Glands: No mass or tenderness Facial Strength: Facial motility symmetric and full bilaterally ENT Pinna: External ear intact and fully developed External canal: Canal is patent with intact skin Tympanic Membrane: Clear and mobile External Nose: No scar or anatomic deformity Internal Nose: Septum is with L sided NSD. No polyp, or purulence. Mucosal edema and erythema present.  Bilateral inferior turbinate hypertrophy.  Lips, Teeth, and gums: Mucosa and teeth intact and viable TMJ: No pain to palpation with full mobility Oral cavity/oropharynx: No erythema or exudate, no lesions present Nasopharynx: No mass or lesion with intact mucosa Hypopharynx: Intact mucosa without pooling of secretions Larynx Glottic: Full true vocal cord mobility without lesion or mass Supraglottic: Normal appearing epiglottis and AE folds Interarytenoid Space: No or minimal pachydermia or edema Subglottic Space: Patent without lesion or edema Neck Neck and Trachea: Midline trachea without mass or lesion Thyroid: No mass or nodularity Lymphatics: No lymphadenopathy  Procedure:  Preoperative diagnosis:  Postoperative diagnosis:   Same  Procedure: Flexible fiberoptic laryngoscopy  Surgeon: Ashok Croon, MD  Anesthesia: Topical lidocaine and Afrin Complications: None Condition is stable throughout exam  Indications and consent:  The patient presents to  the clinic  with Indirect laryngoscopy view was incomplete. Thus it was recommended that they undergo a flexible fiberoptic laryngoscopy. All of the risks, benefits, and potential complications were reviewed with the patient preoperatively and verbal informed consent was obtained.  Procedure: The patient was seated upright in the clinic. Topical lidocaine and Afrin were applied to the nasal cavity. After adequate anesthesia had occurred, I then proceeded to pass the flexible telescope into the nasal cavity. The nasal cavity was patent without rhinorrhea or polyp. The nasopharynx was also patent without mass or lesion. The base of tongue was visualized and was normal. There were no signs of pooling of secretions in the piriform sinuses. The true vocal folds were mobile bilaterally. There were no signs of glottic or supraglottic mucosal lesion or mass. There was moderate interarytenoid pachydermia and post cricoid edema. The telescope was then slowly withdrawn and the patient tolerated the procedure throughout.  Studies Reviewed: Labs with positive rapid strep x 5 ~ 3 mo  Assessment/Plan: Encounter Diagnoses  Name Primary?   Chronic tonsillitis Yes   Tonsillar hypertrophy    Sore throat    Gastroesophageal reflux disease without esophagitis    Nasal congestion    Environmental and seasonal allergies    Nightmares    Snoring    37 year old male overall healthy who is here for several bouts of tonsillitis pharyngitis and positive rapid strep testing over the course of several months received a total of 3 courses of antibiotic for his symptoms which seem to help.  On exam he has evidence of bilateral tonsillar hypertrophy with mild edema and erythema but no exudates.  He typically presents with symptoms of nasal congestion and sore throat.  No fevers or shortness of breath.  I suspect this could represent chronic tonsillitis pharyngitis.  Today he is asymptomatic.  I discussed the impact of GERD LPR and postnasal  drainage on chronic tonsillar inflammation and symptoms of sore throat.  Will initiate medical management.  We also discussed tonsillectomy and its role in the management of chronic tonsillitis.  At this time he is not interested in surgical interventions, and we will only pursue that in the future if he fails medical management.   - start Famotidine and Reflux Gourmet  - research diet and lifestyle changes to minimize  - start Clarinex and Flonase  - gargle with salt water  - return in 6 months   Thank you for allowing me to participate in the care of this patient. Please do not hesitate to contact me with any questions or concerns.   Ashok Croon, MD Otolaryngology [facility] Phone: [phone] Fax: [phone]    10/02/2022, 9:52 PM

## 2022-10-02 NOTE — Patient Instructions (Addendum)
-   start Famotidine and Reflux Gourmet  - research diet and lifestyle changes to minimize  - start Clarinex and Flonase  - gargle with salt water  - return in 6 months

## 2022-10-10 ENCOUNTER — Other Ambulatory Visit (INDEPENDENT_AMBULATORY_CARE_PROVIDER_SITE_OTHER): Payer: Self-pay | Admitting: Otolaryngology

## 2022-10-30 ENCOUNTER — Other Ambulatory Visit: Payer: Self-pay | Admitting: Family Medicine

## 2023-01-05 ENCOUNTER — Ambulatory Visit: Payer: BC Managed Care – PPO | Admitting: Family

## 2023-01-06 ENCOUNTER — Ambulatory Visit: Payer: BC Managed Care – PPO | Admitting: Family

## 2023-01-06 ENCOUNTER — Encounter: Payer: Self-pay | Admitting: Family

## 2023-01-06 VITALS — BP 124/87 | HR 73 | Temp 97.5°F | Ht 76.4 in | Wt 303.6 lb

## 2023-01-06 DIAGNOSIS — J0391 Acute recurrent tonsillitis, unspecified: Secondary | ICD-10-CM

## 2023-01-06 DIAGNOSIS — H6122 Impacted cerumen, left ear: Secondary | ICD-10-CM

## 2023-01-06 LAB — POCT RAPID STREP A (OFFICE): Rapid Strep A Screen: NEGATIVE — NL

## 2023-01-06 NOTE — Progress Notes (Signed)
Patient ID: Joseph [name], male    DOB: 04/14/1985, 37 y.o.   MRN: [id number]  Chief Complaint  Patient presents with   Sore Throat    Pt c/o of sore throat,w/ spots on throat since Sunday. Chest congestion since last Wed, has tried OTC meds. Pt has not taken any at home covid, flu tests.    Discussed the use of AI scribe software for clinical note transcription with the patient, who gave verbal consent to proceed.  History of Present Illness   The patient, with a history of recurrent strep throat, presents with throat discomfort and chest congestion. He reports white patches on his tonsils, which have been a consistent feature of his previous strep throat episodes. However, unlike previous episodes, the white patches have started to resolve on their own. He was seen by an ENT specialist & has been on a regimen of Flonase nasal spray, Pepcid, and oral antihistamine but missed a few doses of the Flonase around the time these symptoms started. He denies fever, but reports nasal drainage & congestion, and a mild cough which was absent in past strep infections.  The patient also has a history of ear wax buildup, which has improved over the past decade. He reports recent itchiness in the left ear.     Assessment & Plan:     Recurrent Tonsillitis - Negative strep test today. History of recurrent episodes with white patches and sore throat. Noted a tonsillolith on examination, but no white patches. -Continue current regimen. -Consider oral hygiene measures to manage tonsilloliths, including brushing twice a day and using an oral rinse like Listerine. -Can take OTC Ibuprofen up to 600mg  tid prn for pain, inflammation.  Left ear Cerumen Impaction - Lifelong history of excessive wax buildup, particularly in the left ear. -Recommend use of over-the-counter Debrox drops or a similar product for a few nights, followed by gentle irrigation with warm water after the 3rd night. -Advise against using Q-tips  in the ear canal.    Subjective:    Outpatient Medications Prior to Visit  Medication Sig Dispense Refill   cyanocobalamin (VITAMIN B12) 1000 MCG tablet TAKE 1 TABLET BY MOUTH EVERY DAY 90 tablet 0   desloratadine (CLARINEX) 5 MG tablet Take 1 tablet (5 mg total) by mouth daily. 90 tablet 3   famotidine (PEPCID) 20 MG tablet TAKE 1 TABLET BY MOUTH TWICE A DAY 180 tablet 1   fluticasone (FLONASE) 50 MCG/ACT nasal spray Place 2 sprays into both nostrils daily. 16 g 6   ketoconazole (NIZORAL) 2 % cream Apply 1 Application topically 2 (two) times daily. 60 g 0   ketoconazole (NIZORAL) 2 % shampoo APPLY 1 APPLICATION TOPICALLY 2 (TWO) TIMES A WEEK. 120 mL 0   omeprazole (PRILOSEC) 40 MG capsule TAKE 1 CAPSULE (40 MG TOTAL) BY MOUTH DAILY. 90 capsule 1   tiZANidine (ZANAFLEX) 4 MG tablet Take 1 tablet (4 mg total) by mouth every 8 (eight) hours as needed for muscle spasms. 30 tablet 1   No facility-administered medications prior to visit.   Past Medical History:  Diagnosis Date   Asthma    GERD (gastroesophageal reflux disease)    History reviewed. No pertinent surgical history. No Known Allergies    Objective:    Physical Exam Vitals and nursing note reviewed.  Constitutional:      General: He is not in acute distress.    Appearance: Normal appearance.  HENT:     Head: Normocephalic.     Right  Ear: Tympanic membrane and ear canal normal.     Left Ear: There is impacted cerumen.     Mouth/Throat:     Mouth: Mucous membranes are moist.     Pharynx: Posterior oropharyngeal erythema present. No pharyngeal swelling, oropharyngeal exudate, uvula swelling or postnasal drip.     Tonsils: No tonsillar exudate or tonsillar abscesses. 1+ on the right. 2+ on the left.     Comments: Tonsil stone noted in left tonsil. Cardiovascular:     Rate and Rhythm: Normal rate and regular rhythm.  Pulmonary:     Effort: Pulmonary effort is normal.     Breath sounds: Normal breath sounds.   Musculoskeletal:        General: Normal range of motion.     Cervical back: Normal range of motion.  Skin:    General: Skin is warm and dry.  Neurological:     Mental Status: He is alert and oriented to person, place, and time.  Psychiatric:        Mood and Affect: Mood normal.    BP 124/87   Pulse 73   Temp (!) 97.5 F (36.4 C)   Ht 6' 4.4" (1.941 m)   Wt (!) 303 lb 9.6 oz (137.7 kg)   SpO2 97%   BMI 36.57 kg/m  Wt Readings from Last 3 Encounters:  01/06/23 (!) 303 lb 9.6 oz (137.7 kg)  10/02/22 299 lb (135.6 kg)  09/23/22 299 lb 9.6 oz (135.9 kg)      Dulce Sellar, NP

## 2023-01-26 ENCOUNTER — Ambulatory Visit: Payer: BC Managed Care – PPO | Admitting: Family

## 2023-01-26 VITALS — BP 125/81 | HR 86 | Temp 98.0°F | Ht 76.4 in | Wt 308.0 lb

## 2023-01-26 DIAGNOSIS — H1031 Unspecified acute conjunctivitis, right eye: Secondary | ICD-10-CM | POA: Diagnosis not present

## 2023-01-26 MED ORDER — POLYMYXIN B-TRIMETHOPRIM 10000-0.1 UNIT/ML-% OP SOLN: 1.0000 [drp] | OPHTHALMIC | 0 refills | Status: DC

## 2023-01-26 NOTE — Progress Notes (Signed)
Patient ID: Joseph [name], male    DOB: Sep 30, 1985, 37 y.o.   MRN: [id number]  Chief Complaint  Patient presents with   Conjunctivitis    Pt c/o nasal/chest congestion and right eye pain behind the eye,redness. Present since Saturday. Has tried mucinex which did help sx.    Discussed the use of AI scribe software for clinical note transcription with the patient, who gave verbal consent to proceed.  History of Present Illness   The patient presents with a one-day history of eye redness and sinus symptoms, which started over the weekend. He reports having had chest congestion for three weeks, which cleared up last week. Over the weekend, he started experiencing nasal congestion, which was worse in the morning and improved throughout the day. Today, he noticed pinkness in his eye, with less congestion than before. He describes the nasal discharge as thick and yellow, with some pressure around the eye. He also reports a headache when bending over and a sensation in his ears when blowing his nose. He has a history of sinus infections but has not had one in a few years. He also mentions that his child recently had an infection that caused pink eye.      Assessment & Plan:     Conjunctivitis, right eye  - Likely secondary to recent exposure to daughter with pink eye. Mild discomfort, no itching. Sclera and conjunctiva w/injection. -Prescribe Polymyxin drops for right eye, advised on use & SE. -Wash hands often to prevent transmission.  Sinusitis - Recent onset of symptoms including nasal congestion, thick yellow mucus, and facial pressure. No severe pain or tooth pain. -Continue home management with Mucinex and ibuprofen. -Consider Sudafed for up to five days for decongestion. -If symptoms worsen or not improved by next week (e.g., mucus becomes darker green, facial pain, foul taste w/mucus, or increased pain), patient to send a message or call office.     Subjective:    Outpatient Medications  Prior to Visit  Medication Sig Dispense Refill   cyanocobalamin (VITAMIN B12) 1000 MCG tablet TAKE 1 TABLET BY MOUTH EVERY DAY 90 tablet 0   desloratadine (CLARINEX) 5 MG tablet Take 1 tablet (5 mg total) by mouth daily. 90 tablet 3   famotidine (PEPCID) 20 MG tablet TAKE 1 TABLET BY MOUTH TWICE A DAY 180 tablet 1   fluticasone (FLONASE) 50 MCG/ACT nasal spray Place 2 sprays into both nostrils daily. 16 g 6   ketoconazole (NIZORAL) 2 % cream Apply 1 Application topically 2 (two) times daily. 60 g 0   ketoconazole (NIZORAL) 2 % shampoo APPLY 1 APPLICATION TOPICALLY 2 (TWO) TIMES A WEEK. 120 mL 0   omeprazole (PRILOSEC) 40 MG capsule TAKE 1 CAPSULE (40 MG TOTAL) BY MOUTH DAILY. 90 capsule 1   tiZANidine (ZANAFLEX) 4 MG tablet Take 1 tablet (4 mg total) by mouth every 8 (eight) hours as needed for muscle spasms. 30 tablet 1   No facility-administered medications prior to visit.   Past Medical History:  Diagnosis Date   Asthma    GERD (gastroesophageal reflux disease)    No past surgical history on file. No Known Allergies    Objective:    Physical Exam Vitals and nursing note reviewed.  Constitutional:      General: He is not in acute distress.    Appearance: Normal appearance.  HENT:     Head: Normocephalic.     Right Ear: Tympanic membrane and ear canal normal.     Left Ear:  Tympanic membrane and ear canal normal.     Nose: Congestion and rhinorrhea (yellow/creamy) present.     Right Sinus: Frontal sinus tenderness (pressure) present. No maxillary sinus tenderness.     Left Sinus: Frontal sinus tenderness (pressure) present. No maxillary sinus tenderness.     Mouth/Throat:     Mouth: Mucous membranes are moist.     Pharynx: No pharyngeal swelling, oropharyngeal exudate, posterior oropharyngeal erythema or uvula swelling.     Tonsils: No tonsillar exudate or tonsillar abscesses.  Eyes:     Extraocular Movements: Extraocular movements intact.     Conjunctiva/sclera:     Right  eye: Right conjunctiva is injected. Exudate present.  Cardiovascular:     Rate and Rhythm: Normal rate and regular rhythm.  Pulmonary:     Effort: Pulmonary effort is normal.     Breath sounds: Normal breath sounds.  Musculoskeletal:        General: Normal range of motion.     Cervical back: Normal range of motion.  Lymphadenopathy:     Head:     Right side of head: No preauricular or posterior auricular adenopathy.     Left side of head: No preauricular or posterior auricular adenopathy.     Cervical: No cervical adenopathy.  Skin:    General: Skin is warm and dry.  Neurological:     Mental Status: He is alert and oriented to person, place, and time.  Psychiatric:        Mood and Affect: Mood normal.    BP 125/81   Pulse 86   Temp 98 F (36.7 C) (Temporal)   Ht 6' 4.4" (1.941 m)   Wt (!) 308 lb (139.7 kg)   SpO2 95%   BMI 37.10 kg/m  Wt Readings from Last 3 Encounters:  01/26/23 (!) 308 lb (139.7 kg)  01/06/23 (!) 303 lb 9.6 oz (137.7 kg)  10/02/22 299 lb (135.6 kg)       Dulce Sellar, NP

## 2023-03-24 ENCOUNTER — Ambulatory Visit: Payer: 59 | Admitting: Family Medicine

## 2023-03-24 ENCOUNTER — Encounter: Payer: Self-pay | Admitting: Family Medicine

## 2023-03-24 VITALS — BP 120/77 | HR 88 | Temp 97.5°F | Ht 76.0 in | Wt 310.2 lb

## 2023-03-24 DIAGNOSIS — J029 Acute pharyngitis, unspecified: Secondary | ICD-10-CM

## 2023-03-24 DIAGNOSIS — J0391 Acute recurrent tonsillitis, unspecified: Secondary | ICD-10-CM | POA: Diagnosis not present

## 2023-03-24 LAB — POCT RAPID STREP A (OFFICE): Rapid Strep A Screen: NEGATIVE

## 2023-03-24 MED ORDER — AMOXICILLIN 500 MG PO CAPS
500.0000 mg | ORAL_CAPSULE | Freq: Three times a day (TID) | ORAL | 0 refills | Status: AC
Start: 2023-03-24 — End: 2023-04-03

## 2023-03-24 NOTE — Assessment & Plan Note (Addendum)
Follows with ENT for this.  Recent flareup for last few days may be due to stopping his Clarinex however his left tonsil has significant erythema with exudate today concerning for infection.  His rapid strep was negative.  He will restart Clarinex.  Will send in pocket prescription for amoxicillin with instruction to not start unless symptoms do not improve in the next few days.  We discussed reasons to return to care or seek emergent care.

## 2023-03-24 NOTE — Patient Instructions (Signed)
It was very nice to see you today!  Your strep test was negative.  Please start your Clarinex back.  Start the amoxicillin if not proving in a few days.  No follow-ups on file.   Take care, Dr Jimmey Ralph  PLEASE NOTE:  If you had any lab tests, please let us know if you have not heard back within a few days. You may see your results on mychart before we have a chance to review them but we will give you a call once they are reviewed by [place].   If we ordered any referrals today, please let us know if you have not heard from their office within the next week.   If you had any urgent prescriptions sent in today, please check with the pharmacy within an hour of our visit to make sure the prescription was transmitted appropriately.   Please try these tips to maintain a healthy lifestyle:  Eat at least 3 REAL meals and 1-2 snacks per day.  Aim for no more than 5 hours between eating.  If you eat breakfast, please do so within one hour of getting up.   Each meal should contain half fruits/vegetables, one quarter protein, and one quarter carbs (no bigger than a computer mouse)  Cut down on sweet beverages. This includes juice, soda, and sweet tea.   Drink at least 1 glass of water with each meal and aim for at least 8 glasses per day  Exercise at least 150 minutes every week.

## 2023-03-24 NOTE — Progress Notes (Signed)
   Joseph [name] is a 38 y.o. male who presents today for an office visit.  Assessment/Plan:  Chronic Problems Addressed Today: Recurrent tonsillitis Follows with ENT for this.  Recent flareup for last few days may be due to stopping his Clarinex however his left tonsil has significant erythema with exudate today concerning for infection.  His rapid strep was negative.  He will restart Clarinex.  Will send in pocket prescription for amoxicillin with instruction to not start unless symptoms do not improve in the next few days.  We discussed reasons to return to care or seek emergent care.       Subjective:  HPI:  See Assessment / plan for status of chronic conditions. Patient here with sore throat for the last 4-5 days. Noticed some red spots in the back of his throat as well. Starting noticing some white spots around. Located in left tonsil. Some pain in left side. No pain on right side. Tried ibuprofen with some improvement. More fatigue. No fevers or chills.  Symptoms are staying about the same. He did stop his allergy medication about a week ago.        Objective:  Physical Exam: BP 120/77   Pulse 88   Temp (!) 97.5 F (36.4 C) (Temporal)   Ht 6\' 4"  (1.93 m)   Wt (!) 310 lb 3.2 oz (140.7 kg)   SpO2 100%   BMI 37.76 kg/m   Gen: No acute distress, resting comfortably HEENT: TMs clear.  Left tonsil with erythema and exudate.  Right tonsil normal. Neuro: Grossly normal, moves all extremities Psych: Normal affect and thought content      Li Bobo M. Jimmey Ralph, MD 03/24/2023 3:15 PM

## 2023-04-21 ENCOUNTER — Encounter: Payer: Self-pay | Admitting: Family Medicine

## 2023-04-21 ENCOUNTER — Ambulatory Visit: Payer: 59 | Admitting: Family Medicine

## 2023-04-21 VITALS — BP 117/76 | HR 71 | Temp 97.7°F | Ht 76.0 in | Wt 313.4 lb

## 2023-04-21 DIAGNOSIS — K219 Gastro-esophageal reflux disease without esophagitis: Secondary | ICD-10-CM | POA: Diagnosis not present

## 2023-04-21 DIAGNOSIS — H9209 Otalgia, unspecified ear: Secondary | ICD-10-CM

## 2023-04-21 DIAGNOSIS — J0391 Acute recurrent tonsillitis, unspecified: Secondary | ICD-10-CM

## 2023-04-21 MED ORDER — AZITHROMYCIN 250 MG PO TABS: ORAL_TABLET | ORAL | 0 refills | Status: DC

## 2023-04-21 NOTE — Assessment & Plan Note (Signed)
 Following with ENT.  OP is clear today without any signs of tonsillitis.

## 2023-04-21 NOTE — Assessment & Plan Note (Signed)
Stable on Prilosec 40 mg daily.  Does not need refill today.

## 2023-04-21 NOTE — Patient Instructions (Signed)
 It was very nice to see you today!  I do not see any signs of an ear infection today.  I will send in a Z-Pak but please do not start most if symptoms worsen or do not improve in the next few days.  It is okay for use over-the-counter meds as needed.  Return if symptoms worsen or fail to improve.   Take care, Dr Jimmey Ralph  PLEASE NOTE:  If you had any lab tests, please let us know if you have not heard back within a few days. You may see your results on mychart before we have a chance to review them but we will give you a call once they are reviewed by [place].   If we ordered any referrals today, please let us know if you have not heard from their office within the next week.   If you had any urgent prescriptions sent in today, please check with the pharmacy within an hour of our visit to make sure the prescription was transmitted appropriately.   Please try these tips to maintain a healthy lifestyle:  Eat at least 3 REAL meals and 1-2 snacks per day.  Aim for no more than 5 hours between eating.  If you eat breakfast, please do so within one hour of getting up.   Each meal should contain half fruits/vegetables, one quarter protein, and one quarter carbs (no bigger than a computer mouse)  Cut down on sweet beverages. This includes juice, soda, and sweet tea.   Drink at least 1 glass of water with each meal and aim for at least 8 glasses per day  Exercise at least 150 minutes every week.

## 2023-04-21 NOTE — Progress Notes (Signed)
=    Joseph [name] is a 38 y.o. male who presents today for an office visit.  Assessment/Plan:  New/Acute Problems: Otalgia  Mild effusion however no signs of otitis media or otitis externa today.  Otherwise normal exam.  May have had a mild viral URI that is resolving.  Dissipate this will continue to improve over the next few days.  Will send a pocket prescription for azithromycin with instruction to not start unless symptoms fail to improve in the next few days.  He can continue over-the-counter meds as needed.  We discussed reasons to return to care.  Chronic Problems Addressed Today: Recurrent tonsillitis Following with ENT.  OP is clear today without any signs of tonsillitis.  Gastroesophageal reflux disease Stable on Prilosec 40 mg daily.  Does not need refill today.     Subjective:  HPI:  Patient here with pain in his left ear. Started a few days ago with pain in his left throat. Saw a white spot in his throat which has since resolved. He did have some neck pain which is getting better. No fevers or chills. Tried ibuprofen with some improvement.      Objective:  Physical Exam: BP 117/76   Pulse 71   Temp 97.7 F (36.5 C) (Temporal)   Ht 6\' 4"  (1.93 m)   Wt (!) 313 lb 6.4 oz (142.2 kg)   SpO2 100%   BMI 38.15 kg/m   Gen: No acute distress, resting comfortably HEENT: Right TM clear.  Left TM with clear effusion.  EAC normal.  OP erythematous.  No exudates.  No lymphadenopathy noted. CV: Regular rate and rhythm with no murmurs appreciated Pulm: Normal work of breathing, clear to auscultation bilaterally with no crackles, wheezes, or rhonchi Neuro: Grossly normal, moves all extremities Psych: Normal affect and thought content      Alezandra Egli M. Jimmey Ralph, MD 04/21/2023 3:05 PM

## 2023-05-08 ENCOUNTER — Encounter: Payer: Self-pay | Admitting: Family Medicine

## 2023-05-08 ENCOUNTER — Ambulatory Visit: Admitting: Family Medicine

## 2023-05-08 VITALS — BP 110/70 | HR 87 | Temp 98.3°F | Ht 76.0 in | Wt 312.4 lb

## 2023-05-08 DIAGNOSIS — J029 Acute pharyngitis, unspecified: Secondary | ICD-10-CM | POA: Diagnosis not present

## 2023-05-08 DIAGNOSIS — J309 Allergic rhinitis, unspecified: Secondary | ICD-10-CM

## 2023-05-08 DIAGNOSIS — K219 Gastro-esophageal reflux disease without esophagitis: Secondary | ICD-10-CM

## 2023-05-08 LAB — POCT RAPID STREP A (OFFICE): Rapid Strep A Screen: NEGATIVE

## 2023-05-08 NOTE — Progress Notes (Signed)
   Joseph [name] is a 38 y.o. male who presents today for an office visit.  Assessment/Plan:  New/Acute Problems: Sore throat Rapid strep negative.  Likely viral URI.  His symptoms already started to improve the last couple of days.  May have some component of allergic rhinitis as well.  He can continue over-the-counter medications.  He has a pocket prescription for azithromycin at home with instruction to not start unless symptoms fail to improve or worsen over the next several days.  We encouraged hydration.  We discussed reasons to return to care.  Follow-up as needed.  Chronic Problems Addressed Today: Gastroesophageal reflux disease Well-controlled on Prilosec 40 mg daily-do not think this is contributing to his above sore throat.  Allergic rhinitis Has seen ENT for this.  He was prescribed Flonase and Clarinex.  Does admit to not being consistent with Flonase.  This may be contributing to his above sore throat.  He will try to be more consistent Flonase.  He will continue his Clarinex as well.     Subjective:  HPI:  Patient here with sore throat. This started about 5 days ago.  Symptoms have improved the last few days. Tried OTC medications with some improvement. Kids have been sick with similar symptoms. Some sneezing. No cough. Reflux has been well controlled. No fevers or chills.  He has seen ENT for recurrent sore throat and tonsillitis.  Was prescribed Clarinex and Flonase.  Estimates not being consistent with his Flonase.  He has been consistent with his omeprazole.  He feels like his reflux symptoms have been well-controlled.       Objective:  Physical Exam: BP 110/70 (BP Location: Left Arm, Patient Position: Sitting, Cuff Size: Large)   Pulse 87   Temp 98.3 F (36.8 C) (Temporal)   Ht 6\' 4"  (1.93 m)   Wt (!) 312 lb 6.1 oz (141.7 kg)   SpO2 98%   BMI 38.02 kg/m   Gen: No acute distress, resting comfortably HEENT: TMs clear.  OP erythematous.  Nasal Ko[place] erythematous  and boggy bilaterally. CV: Regular rate and rhythm with no murmurs appreciated Pulm: Normal work of breathing, clear to auscultation bilaterally with no crackles, wheezes, or rhonchi Neuro: Grossly normal, moves all extremities Psych: Normal affect and thought content      Ceirra Belli M. Jimmey Ralph, MD 05/08/2023 2:49 PM

## 2023-05-08 NOTE — Patient Instructions (Signed)
 It was very nice to see you today!  Your strep test is negative.  Please start the Flonase.  Start the antibiotic if not improving in a few days.  Return if symptoms worsen or fail to improve.   Take care, Dr Jimmey Ralph  PLEASE NOTE:  If you had any lab tests, please let us know if you have not heard back within a few days. You may see your results on mychart before we have a chance to review them but we will give you a call once they are reviewed by [place].   If we ordered any referrals today, please let us know if you have not heard from their office within the next week.   If you had any urgent prescriptions sent in today, please check with the pharmacy within an hour of our visit to make sure the prescription was transmitted appropriately.   Please try these tips to maintain a healthy lifestyle:  Eat at least 3 REAL meals and 1-2 snacks per day.  Aim for no more than 5 hours between eating.  If you eat breakfast, please do so within one hour of getting up.   Each meal should contain half fruits/vegetables, one quarter protein, and one quarter carbs (no bigger than a computer mouse)  Cut down on sweet beverages. This includes juice, soda, and sweet tea.   Drink at least 1 glass of water with each meal and aim for at least 8 glasses per day  Exercise at least 150 minutes every week.

## 2023-05-08 NOTE — Assessment & Plan Note (Signed)
 Has seen ENT for this.  He was prescribed Flonase and Clarinex.  Does admit to not being consistent with Flonase.  This may be contributing to his above sore throat.  He will try to be more consistent Flonase.  He will continue his Clarinex as well.

## 2023-05-08 NOTE — Assessment & Plan Note (Signed)
 Well-controlled on Prilosec 40 mg daily-do not think this is contributing to his above sore throat.

## 2023-08-11 ENCOUNTER — Encounter: Payer: Self-pay | Admitting: Family Medicine

## 2023-08-11 ENCOUNTER — Ambulatory Visit: Admitting: Family Medicine

## 2023-08-11 VITALS — BP 123/80 | HR 77 | Temp 97.5°F | Ht 76.0 in | Wt 312.6 lb

## 2023-08-11 DIAGNOSIS — L299 Pruritus, unspecified: Secondary | ICD-10-CM | POA: Diagnosis not present

## 2023-08-11 DIAGNOSIS — L219 Seborrheic dermatitis, unspecified: Secondary | ICD-10-CM | POA: Diagnosis not present

## 2023-08-11 DIAGNOSIS — R739 Hyperglycemia, unspecified: Secondary | ICD-10-CM | POA: Diagnosis not present

## 2023-08-11 DIAGNOSIS — L290 Pruritus ani: Secondary | ICD-10-CM | POA: Diagnosis not present

## 2023-08-11 NOTE — Patient Instructions (Addendum)
 It was very nice to see you today!  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  This is a benign condition that is probably related to your atopic dermatitis.  Please continue with cortisone.  You can also use zinc oxide.  Let us  know if not improving in the next few weeks.   Return for Annual Physical.   Take care, Dr Daneil Dunker  PLEASE NOTE:  If you had any lab tests, please let us  know if you have not heard back within a few days. You may see your results on mychart before we have a chance to review them but we will give you a call once they are reviewed by us .   If we ordered any referrals today, please let us  know if you have not heard from their office within the next week.   If you had any urgent prescriptions sent in today, please check with the pharmacy within an hour of our visit to make sure the prescription was transmitted appropriately.   Please try these tips to maintain a healthy lifestyle:  Eat at least 3 REAL meals and 1-2 snacks per day.  Aim for no more than 5 hours between eating.  If you eat breakfast, please do so within one hour of getting up.   Each meal should contain half fruits/vegetables, one quarter protein, and one quarter carbs (no bigger than a computer mouse)  Cut down on sweet beverages. This includes juice, soda, and sweet tea.   Drink at least 1 glass of water with each meal and aim for at least 8 glasses per day  Exercise at least 150 minutes every week.

## 2023-08-11 NOTE — Assessment & Plan Note (Signed)
 Had a flareup recently but this is now managed.  Discussed importance of sensitive skin products including lotions, soaps, and detergents.  He can continue taking his Zyrtec.

## 2023-08-11 NOTE — Progress Notes (Signed)
   Joseph [name] is a 38 y.o. male who presents today for an office visit.  Assessment/Plan:  New/Acute Problems: Pruritus Ani  No red flags.  Reassuring exam.  Also reassuring that symptoms have began to improve the last few days.  Cortisone.  Recommend he continue with this.  Also recommended topical zinc oxide.  He will let us  know if not improving in the next 1 to 2 weeks.  Chronic Problems Addressed Today: Pruritus Had a flareup recently but this is now managed.  Discussed importance of sensitive skin products including lotions, soaps, and detergents.  He can continue taking his Zyrtec.  Hyperglycemia Discussed last A1c of 5.9.  He will come back later this year for physical and we can recheck at that time.  Seborrheic dermatitis Mild flareup recently.  He can continue with ketoconazole  cream and shampoo as needed.     Subjective:  HPI:  See assessment / plan for status of chronic conditions. His main concern today is anal itching for the last few weeks. This started as a whole body itch after changing soaps. He went back to his previous regimen and his whole body itching improved. He did try using lotion and cortisone and symptoms have started to improve the last couple of days.  He did have an episode of bleeding with bowel movement a few weeks ago.  He has not had any episodes since then.  Has not noticed any lumps or bumps or lesions to the area.  No pain with bowel movement.  No constipation or diarrhea.       Objective:  Physical Exam: BP 123/80   Pulse 77   Temp (!) 97.5 F (36.4 C) (Temporal)   Ht 6\' 4"  (1.93 m)   Wt (!) 312 lb 9.6 oz (141.8 kg)   SpO2 99%   BMI 38.05 kg/m   Gen: No acute distress, resting comfortably CV: Regular rate and rhythm with no murmurs appreciated Pulm: Normal work of breathing, clear to auscultation bilaterally with no crackles, wheezes, or rhonchi GU: Slight erythema and irritation noted on the anterior anal verge. Neuro: Grossly  normal, moves all extremities Psych: Normal affect and thought content      Reubin Bushnell M. Daneil Dunker, MD 08/11/2023 11:15 AM

## 2023-08-11 NOTE — Assessment & Plan Note (Signed)
 Mild flareup recently.  He can continue with ketoconazole  cream and shampoo as needed.

## 2023-08-11 NOTE — Assessment & Plan Note (Signed)
 Discussed last A1c of 5.9.  He will come back later this year for physical and we can recheck at that time.

## 2023-08-31 ENCOUNTER — Ambulatory Visit (INDEPENDENT_AMBULATORY_CARE_PROVIDER_SITE_OTHER): Admitting: Family Medicine

## 2023-08-31 ENCOUNTER — Encounter: Payer: Self-pay | Admitting: Family Medicine

## 2023-08-31 VITALS — BP 132/82 | HR 91 | Temp 97.9°F | Ht 76.0 in | Wt 316.8 lb

## 2023-08-31 DIAGNOSIS — K219 Gastro-esophageal reflux disease without esophagitis: Secondary | ICD-10-CM | POA: Diagnosis not present

## 2023-08-31 DIAGNOSIS — J309 Allergic rhinitis, unspecified: Secondary | ICD-10-CM | POA: Diagnosis not present

## 2023-08-31 DIAGNOSIS — J029 Acute pharyngitis, unspecified: Secondary | ICD-10-CM

## 2023-08-31 LAB — POCT RAPID STREP A (OFFICE): Rapid Strep A Screen: POSITIVE — AB

## 2023-08-31 NOTE — Progress Notes (Signed)
   Joseph [name] is a 38 y.o. male who presents today for an office visit.  Assessment/Plan:  New/Acute Problems: Tonsillitis Rapid strep positive though he has had numerous positive rapid strep's in the past.  Will check a strep culture to definitively confirm.  He has had improvement with restarting his allergy regimen as directed by ENT.  If this is positive it is okay for him to start the azithromycin .  If his strep culture is negative we will hold off on antibiotics and have him follow-up with ENT if not improving with allergy meds.  Chronic Problems Addressed Today: Allergic rhinitis Worsened after stopping his allergy regimen for a few days.  Symptoms have improved over the last few days since restarting his Flonase  and Clarinex .  This is likely contributing to his above tonsillitis.  He will continue allergy regimen per ENT and follow-up with them if no improvement.  Gastroesophageal reflux disease Have been relatively well-controlled recently on Prilosec 40 mg daily as needed.  Low likelihood that this is contributing to his sore throat/tonsillitis.    Subjective:  HPI:  See assessment / plan for status of chronic conditions. Patient her with white spots on his tonsils. He first noticed this a few days ago. He did have some slight irritation but no pain. No fevers or chills. He does note that he has missed his allergy regimen for a few days but thinks that this may have flared it up.  He restarted this a few days ago and symptoms seem to be improving.        Objective:  Physical Exam: BP 132/82   Pulse 91   Temp 97.9 F (36.6 C) (Temporal)   Ht 6' 4 (1.93 m)   Wt (!) 316 lb 12.8 oz (143.7 kg)   SpO2 98%   BMI 38.56 kg/m   Gen: No acute distress, resting comfortably HEENT: Tonsils with mild erythema.  A few small discrete approximately 1 to 2 mm white appearing pustules bilaterally. CV: Regular rate and rhythm with no murmurs appreciated Pulm: Normal work of breathing,  clear to auscultation bilaterally with no crackles, wheezes, or rhonchi Neuro: Grossly normal, moves all extremities Psych: Normal affect and thought content      Jeiden Daughtridge M. Kennyth, MD 08/31/2023 2:42 PM

## 2023-08-31 NOTE — Patient Instructions (Signed)
 It was very nice to see you today!  We we will check a strep culture.  Please do not start the antibiotic unless this comes back positive.  Please continue your current allergy meds.  Return if symptoms worsen or fail to improve.   Take care, Dr Kennyth  PLEASE NOTE:  If you had any lab tests, please let us  know if you have not heard back within a few days. You may see your results on mychart before we have a chance to review them but we will give you a call once they are reviewed by us .   If we ordered any referrals today, please let us  know if you have not heard from their office within the next week.   If you had any urgent prescriptions sent in today, please check with the pharmacy within an hour of our visit to make sure the prescription was transmitted appropriately.   Please try these tips to maintain a healthy lifestyle:  Eat at least 3 REAL meals and 1-2 snacks per day.  Aim for no more than 5 hours between eating.  If you eat breakfast, please do so within one hour of getting up.   Each meal should contain half fruits/vegetables, one quarter protein, and one quarter carbs (no bigger than a computer mouse)  Cut down on sweet beverages. This includes juice, soda, and sweet tea.   Drink at least 1 glass of water with each meal and aim for at least 8 glasses per day  Exercise at least 150 minutes every week.

## 2023-08-31 NOTE — Addendum Note (Signed)
 Addended by: IDA ELORA HERO on: 08/31/2023 04:07 PM   Modules accepted: Orders

## 2023-08-31 NOTE — Assessment & Plan Note (Signed)
 Worsened after stopping his allergy regimen for a few days.  Symptoms have improved over the last few days since restarting his Flonase  and Clarinex .  This is likely contributing to his above tonsillitis.  He will continue allergy regimen per ENT and follow-up with them if no improvement.

## 2023-08-31 NOTE — Assessment & Plan Note (Signed)
 Have been relatively well-controlled recently on Prilosec 40 mg daily as needed.  Low likelihood that this is contributing to his sore throat/tonsillitis.

## 2023-09-01 ENCOUNTER — Other Ambulatory Visit (HOSPITAL_COMMUNITY)
Admission: RE | Admit: 2023-09-01 | Discharge: 2023-09-01 | Disposition: A | Discharge status: Home / Self Care | Source: Other Acute Inpatient Hospital | Attending: Family Medicine | Admitting: Family Medicine

## 2023-09-01 DIAGNOSIS — J029 Acute pharyngitis, unspecified: Secondary | ICD-10-CM | POA: Insufficient documentation

## 2023-09-02 ENCOUNTER — Encounter: Payer: Self-pay | Admitting: Family Medicine

## 2023-09-03 ENCOUNTER — Ambulatory Visit: Payer: Self-pay | Admitting: Family Medicine

## 2023-09-03 LAB — CULTURE, GROUP A STREP (THRC)

## 2023-09-03 NOTE — Telephone Encounter (Signed)
 Please see result note.  Worth HERO. Kennyth, MD 09/03/2023 3:32 PM

## 2023-09-03 NOTE — Progress Notes (Signed)
 His strep test was negative.  This means he does not have an active strep infection.  It is okay for him to hold off on antibiotics for now however recommend he follow-up with ENT soon if this continues to be an issue.  He should continue his allergy regimen per our last office visit.

## 2023-11-27 ENCOUNTER — Ambulatory Visit: Admitting: Family Medicine

## 2023-11-27 ENCOUNTER — Encounter: Payer: Self-pay | Admitting: Family Medicine

## 2023-11-27 VITALS — BP 119/82 | HR 70 | Temp 98.3°F | Ht 76.0 in | Wt 314.0 lb

## 2023-11-27 DIAGNOSIS — L290 Pruritus ani: Secondary | ICD-10-CM | POA: Diagnosis not present

## 2023-11-27 MED ORDER — HYDROXYZINE HCL 50 MG PO TABS: 50.0000 mg | ORAL_TABLET | Freq: Every evening | ORAL | 0 refills | Status: AC | PRN

## 2023-11-27 NOTE — Patient Instructions (Signed)
 It was very nice to see you today!  VISIT SUMMARY: During your visit, we discussed your persistent anal itching, which has been ongoing for a few months. We reviewed potential causes and treatment options to help alleviate your symptoms.  YOUR PLAN: CHRONIC PRURITUS ANI: You have been experiencing intermittent anal itching, which may be related to stress. We are considering possible fungal or parasitic infections. -Apply ketoconazole  cream twice daily to the affected area. -We will order a stool sample test to check for ova and parasites. -Take hydroxyzine  at night if the itching is disturbing your sleep. -Continue using zinc oxide as needed for relief. -If your symptoms do not improve, we will refer you to a gastroenterologist. -Please update us  on your symptoms in two weeks.  Return if symptoms worsen or fail to improve.   Take care, Dr Kennyth  PLEASE NOTE:  If you had any lab tests, please let us  know if you have not heard back within a few days. You may see your results on mychart before we have a chance to review them but we will give you a call once they are reviewed by us .   If we ordered any referrals today, please let us  know if you have not heard from their office within the next week.   If you had any urgent prescriptions sent in today, please check with the pharmacy within an hour of our visit to make sure the prescription was transmitted appropriately.   Please try these tips to maintain a healthy lifestyle:  Eat at least 3 REAL meals and 1-2 snacks per day.  Aim for no more than 5 hours between eating.  If you eat breakfast, please do so within one hour of getting up.   Each meal should contain half fruits/vegetables, one quarter protein, and one quarter carbs (no bigger than a computer mouse)  Cut down on sweet beverages. This includes juice, soda, and sweet tea.   Drink at least 1 glass of water with each meal and aim for at least 8 glasses per day  Exercise at  least 150 minutes every week.

## 2023-11-27 NOTE — Progress Notes (Signed)
   Joseph [name] is a 38 y.o. male who presents today for an office visit.  Assessment/Plan:  Anal Pruritus  Did not have much improvement with zinc oxide and cortisone.  No other red flag signs or symptoms.  Not having any issues with constipation or diarrhea.  We did discuss potential etiologies including fungal infection, parasites, etc.  Recommend he try topical ketoconazole  for the next couple of weeks.  He will continue with the zinc oxide.  Will send out ova and parasite exam.  Will send in a prescription for hydroxyzine  to use as needed at night to help with the itching.  We will also place referral to gastroenterology if symptoms are not improving.     Subjective:  HPI:  See assessment / plan for status of chronic conditions.   Discussed the use of AI scribe software for clinical note transcription with the patient, who gave verbal consent to proceed.  History of Present Illness Joseph [name] is a 38 year old male who presents with persistent anal itching.  He has been experiencing persistent anal itching since his last visit a few months ago. The itching is located around the anus, specifically on the outer part, and is intermittent. The longest symptom-free period was three days in June. It occurs at various times of the day, including morning and night, and seems to be associated with stress. Cortisone and Desitin have provided some relief, but symptoms persist weekly.  The itching is sometimes accompanied by an inflamed feeling, which is alleviated by ibuprofen. No constipation, diarrhea, pain, or bleeding with bowel movements. No one else in his household has similar symptoms. He has previously used ketoconazole  cream for his eyebrows and has it available. No fever, chills, nausea, or vomiting have been reported. The itching does not wake him at night, although he may notice it if he wakes up.         Objective:  Physical Exam: BP 119/82   Pulse 70   Temp 98.3 F (36.8  C) (Temporal)   Ht 6' 4 (1.93 m)   Wt (!) 314 lb (142.4 kg)   SpO2 99%   BMI 38.22 kg/m   Gen: No acute distress, resting comfortably Neuro: Grossly normal, moves all extremities Psych: Normal affect and thought content      Joseph [name] M. Kennyth, MD 11/27/2023 12:10 PM

## 2024-01-01 ENCOUNTER — Encounter: Payer: Self-pay | Admitting: Family Medicine

## 2024-01-01 ENCOUNTER — Ambulatory Visit: Admitting: Family Medicine

## 2024-01-01 VITALS — BP 128/88 | HR 68 | Temp 97.9°F | Ht 76.0 in | Wt 316.2 lb

## 2024-01-01 DIAGNOSIS — J0391 Acute recurrent tonsillitis, unspecified: Secondary | ICD-10-CM | POA: Diagnosis not present

## 2024-01-01 DIAGNOSIS — J029 Acute pharyngitis, unspecified: Secondary | ICD-10-CM

## 2024-01-01 DIAGNOSIS — K219 Gastro-esophageal reflux disease without esophagitis: Secondary | ICD-10-CM | POA: Diagnosis not present

## 2024-01-01 LAB — POCT RAPID STREP A (OFFICE): Rapid Strep A Screen: NEGATIVE

## 2024-01-01 NOTE — Patient Instructions (Signed)
 It was very nice to see you today!  VISIT SUMMARY: Today, you were seen for a sore throat with white spots on your tonsils. You have been experiencing this since earlier in the week, and it has been causing pain that radiates to your ear and neck. A strep test was negative, but due to your symptoms and history, we are treating you for a possible strep infection.  YOUR PLAN: ACUTE PHARYNGITIS WITH TONSILLAR EXUDATE AND RECURRENT TONSILLITIS: You have a sore throat with white spots on your tonsils, which is causing pain in your ear and neck. Although the strep test was negative, your symptoms and history suggest a possible strep infection. -Start taking the azithromycin  (Z-Pak) as previously prescribed. -Monitor your symptoms and report back if there is no improvement.  Return if symptoms worsen or fail to improve.   Take care, Dr Kennyth  PLEASE NOTE:  If you had any lab tests, please let us  know if you have not heard back within a few days. You may see your results on mychart before we have a chance to review them but we will give you a call once they are reviewed by us .   If we ordered any referrals today, please let us  know if you have not heard from their office within the next week.   If you had any urgent prescriptions sent in today, please check with the pharmacy within an hour of our visit to make sure the prescription was transmitted appropriately.   Please try these tips to maintain a healthy lifestyle:  Eat at least 3 REAL meals and 1-2 snacks per day.  Aim for no more than 5 hours between eating.  If you eat breakfast, please do so within one hour of getting up.   Each meal should contain half fruits/vegetables, one quarter protein, and one quarter carbs (no bigger than a computer mouse)  Cut down on sweet beverages. This includes juice, soda, and sweet tea.   Drink at least 1 glass of water with each meal and aim for at least 8 glasses per day  Exercise at least 150  minutes every week.

## 2024-01-01 NOTE — Assessment & Plan Note (Signed)
 Patient with recurrence of tonsillitis.  Last flareup was about 6 months ago or so he has done well since then.  He has rapid strep was negative however given his exam and history would be reasonable for him to start antibiotics at this point. Also some concern that his rapid strep test may have been a false negative.  He can use over-the-counter meds as needed.  Let us  know if not improving in the next several days and would have him follow back up with ENT.

## 2024-01-01 NOTE — Progress Notes (Signed)
   Joseph [name] is a 38 y.o. male who presents today for an office visit.  Assessment/Plan:  New/Acute Problems: Pruritus ani Improved significantly with ketoconazole  and zinc oxide.  Chronic Problems Addressed Today: Recurrent tonsillitis Patient with recurrence of tonsillitis.  Last flareup was about 6 months ago or so he has done well since then.  He has rapid strep was negative however given his exam and history would be reasonable for him to start antibiotics at this point. Also some concern that his rapid strep test may have been a false negative.  He can use over-the-counter meds as needed.  Let us  know if not improving in the next several days and would have him follow back up with ENT.  Gastroesophageal reflux disease Stable Prilosec 40 mg daily as needed     Subjective:  HPI:  See assessment / plan for status of chronic conditions.   Discussed the use of AI scribe software for clinical note transcription with the patient, who gave verbal consent to proceed.  History of Present Illness Joseph [name] is a 38 year old male who presents with a sore throat and white spots on the tonsils.  He has been experiencing a sore throat for the last several days. initially more painful than usual. The left tonsil was notably red with a single white spot, which increased to five spots by last night. This morning, the number of spots decreased to four on the left tonsil. He also experiences pain radiating to the ear and neck. No fever, chills, or cough. He has some mucus in the throat but no significant respiratory symptoms.   He has a history of recurrent sore throats, with the last episode in June. Previously, doubling the nasal spray helped resolve the issue when he had a single white spot. He was surprised by the increase in spots this time.  He has a history of anal itching, which has improved with ketoconazole  and cooler temperatures. He also reports a history of heartburn and reflux,  which has been better over the past year, with infrequent episodes occurring every three to four months. He has not been using medication for this recently.  He has a Z-Pak (azithromycin ) from a previous prescription in February, which he has not yet used.         Objective:  Physical Exam: BP 128/88   Pulse 68   Temp 97.9 F (36.6 C) (Temporal)   Ht 6' 4 (1.93 m)   Wt (!) 316 lb 3.2 oz (143.4 kg)   SpO2 98%   BMI 38.49 kg/m   Gen: No acute distress, resting comfortably HEENT: TMs with clear effusion.  Tonsillar hypertrophy noted left worse than right with exudate noted on left tonsil.   CV: Regular rate and rhythm with no murmurs appreciated Pulm: Normal work of breathing, clear to auscultation bilaterally with no crackles, wheezes, or rhonchi Neuro: Grossly normal, moves all extremities Psych: Normal affect and thought content      Bibi Economos M. Kennyth, MD 01/01/2024 12:07 PM

## 2024-01-01 NOTE — Assessment & Plan Note (Signed)
 Stable Prilosec 40 mg daily as needed

## 2024-01-26 ENCOUNTER — Ambulatory Visit: Admitting: Family Medicine

## 2024-01-26 VITALS — BP 114/72 | HR 79 | Temp 98.8°F | Resp 16 | Ht 76.5 in | Wt 319.6 lb

## 2024-01-26 DIAGNOSIS — J0391 Acute recurrent tonsillitis, unspecified: Secondary | ICD-10-CM

## 2024-01-26 DIAGNOSIS — J309 Allergic rhinitis, unspecified: Secondary | ICD-10-CM | POA: Diagnosis not present

## 2024-01-26 DIAGNOSIS — J029 Acute pharyngitis, unspecified: Secondary | ICD-10-CM | POA: Diagnosis not present

## 2024-01-26 LAB — POCT RAPID STREP A (OFFICE): Rapid Strep A Screen: POSITIVE — AB

## 2024-01-26 MED ORDER — AMOXICILLIN 500 MG PO CAPS: 500.0000 mg | ORAL_CAPSULE | Freq: Three times a day (TID) | ORAL | 0 refills | Status: AC

## 2024-01-26 NOTE — Assessment & Plan Note (Signed)
 Patient's rapid strep positive.  Did discuss that this may be a sequela of his most recent flareup last month.  We discussed obtaining a strep culture before another round of antibiotics however he would like to go ahead and start antibiotics at this point as his symptoms are consistent with previous strep infections.  Will start amoxicillin  500 mg 3 times daily for the next 10 days.  Is also okay for him to use over-the-counter meds as needed.  Encouraged hydration.  He will let us  know if not improving though did advise patient to follow-up with ENT soon for this.

## 2024-01-26 NOTE — Progress Notes (Signed)
   Joseph [name] is a 38 y.o. male who presents today for an office visit.  Assessment/Plan:  Chronic Problems Addressed Today: Recurrent tonsillitis Patient's rapid strep positive.  Did discuss that this may be a sequela of his most recent flareup last month.  We discussed obtaining a strep culture before another round of antibiotics however he would like to go ahead and start antibiotics at this point as his symptoms are consistent with previous strep infections.  Will start amoxicillin  500 mg 3 times daily for the next 10 days.  Is also okay for him to use over-the-counter meds as needed.  Encouraged hydration.  He will let us  know if not improving though did advise patient to follow-up with ENT soon for this.  Allergic rhinitis He is on Flonase  and Clarinex  per ENT.  Likely contributing to above recurrent tonsillitis.  He will follow-up with ENT soon as above.     Subjective:  HPI:  See assessment / plan for status of chronic conditions.    Discussed the use of AI scribe software for clinical note transcription with the patient, who gave verbal consent to proceed.  History of Present Illness Joseph [name] is a 38 year old male who presents with recurrent sore throat and tonsillitis.  He began experiencing a dry sensation in his right tonsil on Saturday, which progressed to pain in his ear and neck. By Saturday, he noticed white spots on his tonsils, similar to his last episode where the infection was aggressive and spread from the left to the right tonsil. This time, the symptoms started on the right side, where the previous infection had ended.  The sore throat was more intense on Sunday but has since slightly improved. He continues to feel discomfort but notes no further growth of the white spots since Sunday. He has not experienced fever, chills, or cough.  For symptom management, he has been using allergy medication, nasal spray, and ibuprofen for pain relief. During his last  episode, the antibiotic treatment initially did not work, and the condition worsened before improving. He mentions that he was prescribed azithromycin  in February, which he did not take, and this is only the second episode in twelve months.         Objective:  Physical Exam: BP 114/72   Pulse 79   Temp 98.8 F (37.1 C) (Temporal)   Resp 16   Ht 6' 4.5 (1.943 m)   Wt (!) 319 lb 9.6 oz (145 kg)   SpO2 99%   BMI 38.40 kg/m   Gen: No acute distress, resting comfortably HEENT: OP erythematous with mild tonsillar hypertrophy noted.  No exudates. CV: Regular rate and rhythm with no murmurs appreciated Pulm: Normal work of breathing, clear to auscultation bilaterally with no crackles, wheezes, or rhonchi Neuro: Grossly normal, moves all extremities Psych: Normal affect and thought content      Marwa Fuhrman M. Kennyth, MD 01/26/2024 9:14 AM

## 2024-01-26 NOTE — Patient Instructions (Signed)
 It was very nice to see you today!  VISIT SUMMARY: During your visit, we discussed your recurrent sore throat and tonsillitis, which started on Saturday and has shown some improvement. We also reviewed your ongoing management of allergic rhinitis.  YOUR PLAN: ACUTE RECURRENT TONSILLITIS AND ACUTE PHARYNGITIS: You have recurrent tonsillitis with white spots on your tonsils, which has shown some improvement. -Take amoxicillin  500 mg five times a day for 10 days. -Continue using ibuprofen for pain relief. -Use your allergy medications as needed. -Report if your symptoms do not improve.  ALLERGIC RHINITIS: You have chronic allergic rhinitis. -Continue using your current allergy medications and nasal spray.  Return if symptoms worsen or fail to improve.   Take care, Dr Kennyth  PLEASE NOTE:  If you had any lab tests, please let us  know if you have not heard back within a few days. You may see your results on mychart before we have a chance to review them but we will give you a call once they are reviewed by us .   If we ordered any referrals today, please let us  know if you have not heard from their office within the next week.   If you had any urgent prescriptions sent in today, please check with the pharmacy within an hour of our visit to make sure the prescription was transmitted appropriately.   Please try these tips to maintain a healthy lifestyle:  Eat at least 3 REAL meals and 1-2 snacks per day.  Aim for no more than 5 hours between eating.  If you eat breakfast, please do so within one hour of getting up.   Each meal should contain half fruits/vegetables, one quarter protein, and one quarter carbs (no bigger than a computer mouse)  Cut down on sweet beverages. This includes juice, soda, and sweet tea.   Drink at least 1 glass of water with each meal and aim for at least 8 glasses per day  Exercise at least 150 minutes every week.

## 2024-01-26 NOTE — Assessment & Plan Note (Signed)
 He is on Flonase  and Clarinex  per ENT.  Likely contributing to above recurrent tonsillitis.  He will follow-up with ENT soon as above.

## 2024-02-18 ENCOUNTER — Other Ambulatory Visit: Payer: Self-pay | Admitting: Family Medicine

## 2024-02-18 ENCOUNTER — Other Ambulatory Visit (INDEPENDENT_AMBULATORY_CARE_PROVIDER_SITE_OTHER): Payer: Self-pay | Admitting: Otolaryngology

## 2024-03-02 ENCOUNTER — Ambulatory Visit: Payer: Self-pay

## 2024-03-02 ENCOUNTER — Other Ambulatory Visit: Payer: Self-pay

## 2024-03-02 ENCOUNTER — Emergency Department

## 2024-03-02 ENCOUNTER — Emergency Department: Admission: EM | Admit: 2024-03-02 | Discharge: 2024-03-02 | Disposition: A | Discharge status: Home / Self Care

## 2024-03-02 DIAGNOSIS — J45909 Unspecified asthma, uncomplicated: Secondary | ICD-10-CM | POA: Insufficient documentation

## 2024-03-02 DIAGNOSIS — S0990XA Unspecified injury of head, initial encounter: Secondary | ICD-10-CM | POA: Diagnosis present

## 2024-03-02 DIAGNOSIS — W228XXA Striking against or struck by other objects, initial encounter: Secondary | ICD-10-CM | POA: Diagnosis not present

## 2024-03-02 MED ORDER — KETOROLAC TROMETHAMINE 30 MG/ML IJ SOLN
30.0000 mg | Freq: Once | INTRAMUSCULAR | Status: AC
  Administered 2024-03-02: 30 mg via INTRAMUSCULAR
  Filled 2024-03-02: qty 1

## 2024-03-02 MED ORDER — ACETAMINOPHEN 325 MG PO TABS
650.0000 mg | ORAL_TABLET | Freq: Once | ORAL | Status: AC
  Administered 2024-03-02: 650 mg via ORAL
  Filled 2024-03-02: qty 2

## 2024-03-02 MED ORDER — LIDOCAINE 5 % EX PTCH
1.0000 | MEDICATED_PATCH | CUTANEOUS | Status: DC
  Administered 2024-03-02: 1 via TRANSDERMAL
  Filled 2024-03-02: qty 1

## 2024-03-02 NOTE — Discharge Instructions (Signed)
 You were evaluated in the ED for a headache and/or head injury. Your evaluation did not reveal signs of a serious condition such as a brain bleed or skull fracture and were reassuring. Your CT head is normal.   Consider these rest and recovery strategies at home:  - Avoid strenous activity , exercise or heavy lifting for a few days.  -Limit screen time and activities requiring intense focus.  - Gradually return to normal activities as symptoms improve   Take tylenol as needed.   Apply ice/cold pack to the affected area 10-20 minutes every 2-3 hours for the first 24-48 hours to reduce pain and swelling. Avoid alcohol and caffeine.   Follow up with you PCP in 1-2 weeks if symptoms persist or worsen.   Pain control:  Ibuprofen (motrin/aleve/advil) - You can take 3 tablets (600 mg) every 6 hours as needed for pain.  Acetaminophen (tylenol) - You can take 2 extra strength tablets (1000 mg) every 6 hours as needed for pain.  You can alternate these medications or take them together.  Make sure you eat food/drink water when taking these medications.

## 2024-03-02 NOTE — Telephone Encounter (Signed)
 Called and spoke to patient to ensure he was okay and to confirm that he was in the ED r headed to the ED. Patient confirmed that he was currently en route to the ED and his wife was driving. No other questions or concerns were addressed during this call.

## 2024-03-02 NOTE — ED Provider Notes (Signed)
 "   [facility] Emergency Department Provider Note     Event Date/Time   First MD Initiated Contact with Patient 03/02/24 1042     (approximate)   History   Head Injury   HPI  Joseph [name] is a 38 y.o. male with a past medical history of GERD, asthma and chronic migraines presents to the ED for evaluation of head injury and right sided neck pain sustained yesterday.  Patient reports he was in the process of moving yesterday when he stood up and hit a wooden beam to his front temporal scalp region. He states feeling a localized pain to his right side neck at impact. He reports since he has experienced a headache, fatigue and nausea.  He denies LOC, vision changes, extremity weakness, numbness or tingling. No chest pain or shortness of breath.      Physical Exam   Triage Vital Signs: ED Triage Vitals  Encounter Vitals Group     BP 03/02/24 1022 134/89     Girls Systolic BP Percentile --      Girls Diastolic BP Percentile --      Boys Systolic BP Percentile --      Boys Diastolic BP Percentile --      Pulse Rate 03/02/24 1022 71     Resp 03/02/24 1022 18     Temp 03/02/24 1022 98.4 F (36.9 C)     Temp Source 03/02/24 1022 Oral     SpO2 03/02/24 1022 98 %     Weight 03/02/24 1021 (!) 320 lb (145.2 kg)     Height 03/02/24 1021 6' 5 (1.956 m)     Head Circumference --      Peak Flow --      Pain Score 03/02/24 1021 6     Pain Loc --      Pain Education --      Exclude from Growth Chart --     Most recent vital signs: Vitals:   03/02/24 1022 03/02/24 1208  BP: 134/89 128/86  Pulse: 71 68  Resp: 18 18  Temp: 98.4 F (36.9 C)   SpO2: 98% 100%   General: Well appearing and comfortable.  GCS 15.  Alert and oriented. INAD.    Head:  NCAT.  No scalp hematoma noted.  Nontender to palpation.  Skin remains intact. Eyes:  PERRLA. EOMI.  Ears:  No postauricular ecchymosis.   Neck:   No cervical spine tenderness to palpation. Full ROM without  difficulty.  CV:  Good peripheral perfusion. RRR. RESP:  Normal effort. LCTAB.  NEURO: Cranial nerves II-XII intact. No focal deficits. Speech is clear. Sensation and motor function intact. Normal muscle strength of UE & LE. Gait is steady.   ED Results / Procedures / Treatments   Labs (all labs ordered are listed, but only abnormal results are displayed) Labs Reviewed - No data to display  RADIOLOGY  I personally viewed and evaluated these images as part of my medical decision making, as well as reviewing the written report by the radiologist.  ED Provider Interpretation: Normal-appearing CT head.  CT Head Wo Contrast Result Date: 03/02/2024 EXAM: CT HEAD WITHOUT CONTRAST 03/02/2024 11:22:00 AM TECHNIQUE: CT of the head was performed without the administration of intravenous contrast. Automated exposure control, iterative reconstruction, and/or weight based adjustment of the mA/kV was utilized to reduce the radiation dose to as low as reasonably achievable. COMPARISON: None available. CLINICAL HISTORY: head injury FINDINGS: BRAIN AND VENTRICLES: No acute hemorrhage. No  evidence of acute infarct. No hydrocephalus. No extra-axial collection. No mass effect or midline shift. ORBITS: No acute abnormality. SINUSES: No acute abnormality. SOFT TISSUES AND SKULL: No acute soft tissue abnormality. No skull fracture. IMPRESSION: 1. No acute intracranial abnormality. Electronically signed by: Evalene Coho MD 03/02/2024 11:27 AM EST RP Workstation: [id number]    PROCEDURES:  Critical Care performed: No  Procedures   MEDICATIONS ORDERED IN ED: Medications  lidocaine  (LIDODERM ) 5 % 1 patch (1 patch Transdermal Patch Applied 03/02/24 1113)  ketorolac (TORADOL) 30 MG/ML injection 30 mg (30 mg Intramuscular Given 03/02/24 1112)  acetaminophen (TYLENOL) tablet 650 mg (650 mg Oral Given 03/02/24 1113)     IMPRESSION / MDM / ASSESSMENT AND PLAN / ED COURSE  I reviewed the triage vital signs  and the nursing notes.                             Clinical Course as of 03/02/24 1228  Wed Mar 02, 2024  1146 CT Head Wo Contrast IMPRESSION: 1. No acute intracranial abnormality.   [MH]    Clinical Course User Index [MH] [name], PA-C    38 y.o. male presents to the emergency department for evaluation and treatment of minor head injury. See HPI for further details.   Differential diagnosis includes, but is not limited to tension headache, migraine, strain, contusion, less likely but considered fracture, ICH  Patient's presentation is most consistent with acute complicated illness / injury requiring diagnostic workup.  Patient is alert and oriented.  He is hemodynamically stable.  Physical exam finding as stated above with a normal neuroexam.  Plan will be to obtain CT head and treat with IM Toradol, Tylenol and lidocaine  patch and reassess.    On reassessment patient is sleeping peacefully and comfortably.  CT head is reassuring.  Presentation is most consistent with tension headache secondary to minor head injury.  Patient is in stable condition for discharge home.  Discussed alternation between NSAIDs and Tylenol for headache at home in which she verbalized.  ED return precautions discussed.  He is advised to follow up with his primary care provider as needed.  All questions and concerns were addressed during this ED visit.     FINAL CLINICAL IMPRESSION(S) / ED DIAGNOSES   Final diagnoses:  Injury of head, initial encounter    Rx / DC Orders   ED Discharge Orders     None        Note:  This document was prepared using Dragon voice recognition software and may include unintentional dictation errors.    [name], PA-C 03/02/24 1232    Nicholaus Rolland BRAVO, MD 03/02/24 1534  "

## 2024-03-02 NOTE — ED Triage Notes (Signed)
 Pt ambulatory to triage C/O head injury. States hit head on beam yesterday while moving. Reports nausea, dizziness, headache since hitting head.

## 2024-03-02 NOTE — Telephone Encounter (Signed)
 FYI Only or Action Required?: FYI only for provider: ED advised.  Patient was last seen in primary care on 01/26/2024 by Joseph [name] Worth HERO, MD.  Called Nurse Triage reporting Head Injury.  Symptoms began yesterday.  Interventions attempted: Rest, hydration, or home remedies.  Symptoms are: unchanged.  Triage Disposition: Go to ED Now (Notify PCP)  Patient/caregiver understands and will follow disposition?: Yes  Reason for Disposition  [1] ACUTE NEURO SYMPTOM AND [2] now fine  (Definition: Difficult to awaken OR confused thinking and talking OR slurred speech OR weakness of arms or legs OR unsteady walking.)  Answer Assessment - Initial Assessment Questions Patient reports that he hit his head on ceiling beam yesterday afternoon while moving. He states that he felt strange and fatigued for about 30 minutes after, but was able to continue activities. He reports feeling the impact down to his neck and he has had Joseph [name] [name] constant headache and neck pain/stiffness since incident. He slept ok, but woke up this morning feeling Joseph [name] [name] little nauseous. ED advised. Advised to have someone drive him.   1. MECHANISM: How did the injury happen? For falls, ask: What height did you fall from? and What surface did you fall against?      Hit head on ceiling beam   2. ONSET: When did the injury happen? (e.g., minutes, hours ago)      Yesterday around 4:30PM  3. NEUROLOGIC SYMPTOMS: Was there any loss of consciousness? Are there any other neurological symptoms?      No LOC, reports feeling strange  and very tired for about 30 mins after   4. MENTAL STATUS: Does the person know who they are, who you are, and where they are?      Joseph [name] [name]/Ox3 5. LOCATION: What part of the head was hit?      Top right part of head  6. SCALP APPEARANCE: What does the scalp look like? Is it bleeding now? If Yes, ask: Is it difficult to stop?      No cuts, bruising, swelling  7. SIZE: For cuts, bruises, or swelling,  ask: How large is it? (e.g., inches or centimeters)      NA  8. PAIN: Is there any pain? If Yes, ask: How bad is it? (Scale 0-10; or none, mild, moderate, severe)     5-6/10 headache  9. TETANUS: For any breaks in the skin, ask: When was your last tetanus booster?     NA  10. BLOOD THINNERS: Do you take any blood thinners? (e.g., aspirin, clopidogrel / Plavix, coumadin, heparin). Notes: Other strong blood thinners include: Arixtra (fondaparinux), Eliquis (apixaban), Pradaxa (dabigatran), and Xarelto (rivaroxaban).       No  11. OTHER SYMPTOMS: Do you have any other symptoms? (e.g., neck pain, vomiting)       Neck pain/stiffness and nausea  12. PREGNANCY: Is there any chance you are pregnant? When was your last menstrual period?       NA  Protocols used: Head Injury-Joseph [name] [name]-AH  Copied from CRM [id number]. Topic: Clinical - Red Word Triage >> Mar 02, 2024  9:06 AM Joseph [name] Joseph [name] [name]: Red Word that prompted transfer to Nurse Triage: pt hit his head on Joseph [name] [name] beam in his new home yesterday, he is reporting nonstop headaches and neck pain, denies dizziness or disorientation, pt has history of chronic migraines, he reports nausea but he's not sure if it's due to headache or due to not having had breakfast yet.

## 2024-03-14 ENCOUNTER — Inpatient Hospital Stay: Admitting: Family Medicine
